# Patient Record
Sex: Female | Born: 1960 | Race: White | Hispanic: No | Marital: Married | Smoking: Current every day smoker
Health system: Southern US, Community
[De-identification: ages and names within clinical notes are randomized; demographics above are authoritative.]

## PROBLEM LIST (undated history)

## (undated) DIAGNOSIS — M810 Age-related osteoporosis without current pathological fracture: Secondary | ICD-10-CM

## (undated) DIAGNOSIS — M069 Rheumatoid arthritis, unspecified: Secondary | ICD-10-CM

## (undated) DIAGNOSIS — H01009 Unspecified blepharitis unspecified eye, unspecified eyelid: Secondary | ICD-10-CM

## (undated) DIAGNOSIS — L4050 Arthropathic psoriasis, unspecified: Secondary | ICD-10-CM

## (undated) DIAGNOSIS — M797 Fibromyalgia: Secondary | ICD-10-CM

## (undated) DIAGNOSIS — M459 Ankylosing spondylitis of unspecified sites in spine: Secondary | ICD-10-CM

## (undated) DIAGNOSIS — E119 Type 2 diabetes mellitus without complications: Secondary | ICD-10-CM

## (undated) HISTORY — DX: Arthropathic psoriasis, unspecified: L40.50

## (undated) HISTORY — DX: Unspecified blepharitis unspecified eye, unspecified eyelid: H01.009

## (undated) HISTORY — DX: Fibromyalgia: M79.7

---

## 1998-06-12 ENCOUNTER — Emergency Department (HOSPITAL_COMMUNITY): Admission: EM | Admit: 1998-06-12 | Discharge: 1998-06-12 | Payer: Self-pay | Admitting: Emergency Medicine

## 1999-03-11 ENCOUNTER — Other Ambulatory Visit: Admission: RE | Admit: 1999-03-11 | Discharge: 1999-03-11 | Payer: Self-pay | Admitting: Obstetrics and Gynecology

## 1999-11-19 ENCOUNTER — Encounter: Payer: Self-pay | Admitting: Orthopedic Surgery

## 1999-11-19 ENCOUNTER — Ambulatory Visit (HOSPITAL_COMMUNITY): Admission: RE | Admit: 1999-11-19 | Discharge: 1999-11-19 | Payer: Self-pay | Admitting: Orthopedic Surgery

## 2000-04-13 ENCOUNTER — Other Ambulatory Visit: Admission: RE | Admit: 2000-04-13 | Discharge: 2000-04-13 | Payer: Self-pay | Admitting: Obstetrics and Gynecology

## 2001-04-23 ENCOUNTER — Other Ambulatory Visit: Admission: RE | Admit: 2001-04-23 | Discharge: 2001-04-23 | Payer: Self-pay | Admitting: Obstetrics and Gynecology

## 2001-10-18 ENCOUNTER — Emergency Department (HOSPITAL_COMMUNITY): Admission: EM | Admit: 2001-10-18 | Discharge: 2001-10-18 | Payer: Self-pay | Admitting: Emergency Medicine

## 2001-10-18 ENCOUNTER — Encounter: Payer: Self-pay | Admitting: Emergency Medicine

## 2001-12-08 HISTORY — PX: CARPAL TUNNEL RELEASE: SHX101

## 2002-05-06 ENCOUNTER — Encounter: Admission: RE | Admit: 2002-05-06 | Discharge: 2002-05-06 | Payer: Self-pay | Admitting: *Deleted

## 2002-05-06 ENCOUNTER — Encounter: Payer: Self-pay | Admitting: *Deleted

## 2002-06-03 ENCOUNTER — Ambulatory Visit (HOSPITAL_COMMUNITY): Admission: RE | Admit: 2002-06-03 | Discharge: 2002-06-03 | Payer: Self-pay | Admitting: Family Medicine

## 2002-07-15 ENCOUNTER — Encounter: Payer: Self-pay | Admitting: *Deleted

## 2002-07-15 ENCOUNTER — Encounter: Admission: RE | Admit: 2002-07-15 | Discharge: 2002-07-15 | Payer: Self-pay | Admitting: *Deleted

## 2002-07-21 ENCOUNTER — Encounter: Admission: RE | Admit: 2002-07-21 | Discharge: 2002-10-19 | Payer: Self-pay | Admitting: *Deleted

## 2002-10-31 ENCOUNTER — Other Ambulatory Visit: Admission: RE | Admit: 2002-10-31 | Discharge: 2002-10-31 | Payer: Self-pay | Admitting: Obstetrics and Gynecology

## 2005-01-08 ENCOUNTER — Other Ambulatory Visit: Admission: RE | Admit: 2005-01-08 | Discharge: 2005-01-08 | Payer: Self-pay | Admitting: Obstetrics and Gynecology

## 2006-03-16 ENCOUNTER — Other Ambulatory Visit: Admission: RE | Admit: 2006-03-16 | Discharge: 2006-03-16 | Payer: Self-pay | Admitting: Obstetrics and Gynecology

## 2006-10-13 ENCOUNTER — Encounter: Admission: RE | Admit: 2006-10-13 | Discharge: 2006-10-13 | Payer: Self-pay | Admitting: Obstetrics and Gynecology

## 2006-10-26 ENCOUNTER — Encounter: Admission: RE | Admit: 2006-10-26 | Discharge: 2006-10-26 | Payer: Self-pay | Admitting: Obstetrics and Gynecology

## 2008-10-03 ENCOUNTER — Encounter: Admission: RE | Admit: 2008-10-03 | Discharge: 2008-10-03 | Payer: Self-pay | Admitting: Obstetrics and Gynecology

## 2010-12-29 ENCOUNTER — Encounter: Payer: Self-pay | Admitting: Obstetrics and Gynecology

## 2011-01-07 ENCOUNTER — Other Ambulatory Visit: Payer: Self-pay | Admitting: Obstetrics and Gynecology

## 2011-01-07 DIAGNOSIS — Z1231 Encounter for screening mammogram for malignant neoplasm of breast: Secondary | ICD-10-CM

## 2011-01-07 DIAGNOSIS — Z1239 Encounter for other screening for malignant neoplasm of breast: Secondary | ICD-10-CM

## 2011-01-20 ENCOUNTER — Ambulatory Visit
Admission: RE | Admit: 2011-01-20 | Discharge: 2011-01-20 | Disposition: A | Discharge status: Home / Self Care | Payer: BC Managed Care – PPO | Source: Ambulatory Visit | Attending: Obstetrics and Gynecology | Admitting: Obstetrics and Gynecology

## 2011-01-20 DIAGNOSIS — Z1231 Encounter for screening mammogram for malignant neoplasm of breast: Secondary | ICD-10-CM

## 2013-01-14 ENCOUNTER — Other Ambulatory Visit: Payer: Self-pay | Admitting: Obstetrics and Gynecology

## 2013-01-14 DIAGNOSIS — Z1231 Encounter for screening mammogram for malignant neoplasm of breast: Secondary | ICD-10-CM

## 2013-01-28 ENCOUNTER — Ambulatory Visit
Admission: RE | Admit: 2013-01-28 | Discharge: 2013-01-28 | Disposition: A | Discharge status: Home / Self Care | Payer: BC Managed Care – PPO | Source: Ambulatory Visit | Attending: Obstetrics and Gynecology | Admitting: Obstetrics and Gynecology

## 2013-01-28 DIAGNOSIS — Z1231 Encounter for screening mammogram for malignant neoplasm of breast: Secondary | ICD-10-CM

## 2013-01-31 ENCOUNTER — Other Ambulatory Visit: Payer: Self-pay | Admitting: Obstetrics and Gynecology

## 2013-01-31 DIAGNOSIS — R928 Other abnormal and inconclusive findings on diagnostic imaging of breast: Secondary | ICD-10-CM

## 2013-02-22 ENCOUNTER — Ambulatory Visit
Admission: RE | Admit: 2013-02-22 | Discharge: 2013-02-22 | Disposition: A | Discharge status: Home / Self Care | Payer: BC Managed Care – PPO | Source: Ambulatory Visit | Attending: Obstetrics and Gynecology | Admitting: Obstetrics and Gynecology

## 2013-02-22 DIAGNOSIS — R928 Other abnormal and inconclusive findings on diagnostic imaging of breast: Secondary | ICD-10-CM

## 2013-04-25 DIAGNOSIS — L658 Other specified nonscarring hair loss: Secondary | ICD-10-CM | POA: Insufficient documentation

## 2013-11-28 ENCOUNTER — Ambulatory Visit: Payer: BC Managed Care – PPO | Admitting: *Deleted

## 2013-12-13 ENCOUNTER — Ambulatory Visit: Payer: BC Managed Care – PPO | Admitting: *Deleted

## 2014-01-04 ENCOUNTER — Ambulatory Visit: Payer: BC Managed Care – PPO | Admitting: *Deleted

## 2015-05-02 ENCOUNTER — Other Ambulatory Visit: Payer: Self-pay

## 2015-05-02 DIAGNOSIS — Z1231 Encounter for screening mammogram for malignant neoplasm of breast: Secondary | ICD-10-CM

## 2015-06-04 ENCOUNTER — Ambulatory Visit
Admission: RE | Admit: 2015-06-04 | Discharge: 2015-06-04 | Disposition: A | Discharge status: Home / Self Care | Payer: Managed Care, Other (non HMO) | Source: Ambulatory Visit

## 2015-06-04 DIAGNOSIS — Z1231 Encounter for screening mammogram for malignant neoplasm of breast: Secondary | ICD-10-CM

## 2015-06-05 ENCOUNTER — Other Ambulatory Visit: Payer: Self-pay | Admitting: Obstetrics and Gynecology

## 2015-06-05 DIAGNOSIS — R928 Other abnormal and inconclusive findings on diagnostic imaging of breast: Secondary | ICD-10-CM

## 2015-06-12 ENCOUNTER — Other Ambulatory Visit: Payer: Managed Care, Other (non HMO)

## 2015-06-13 ENCOUNTER — Ambulatory Visit
Admission: RE | Admit: 2015-06-13 | Discharge: 2015-06-13 | Disposition: A | Discharge status: Home / Self Care | Payer: Managed Care, Other (non HMO) | Source: Ambulatory Visit | Attending: Obstetrics and Gynecology | Admitting: Obstetrics and Gynecology

## 2015-06-13 DIAGNOSIS — R928 Other abnormal and inconclusive findings on diagnostic imaging of breast: Secondary | ICD-10-CM

## 2015-08-17 DIAGNOSIS — M5416 Radiculopathy, lumbar region: Secondary | ICD-10-CM | POA: Insufficient documentation

## 2015-10-23 DIAGNOSIS — M5136 Other intervertebral disc degeneration, lumbar region: Secondary | ICD-10-CM | POA: Insufficient documentation

## 2016-10-09 ENCOUNTER — Other Ambulatory Visit: Payer: Self-pay | Admitting: Obstetrics and Gynecology

## 2016-10-09 DIAGNOSIS — Z1231 Encounter for screening mammogram for malignant neoplasm of breast: Secondary | ICD-10-CM

## 2016-10-24 ENCOUNTER — Ambulatory Visit
Admission: RE | Admit: 2016-10-24 | Discharge: 2016-10-24 | Disposition: A | Discharge status: Home / Self Care | Payer: BLUE CROSS/BLUE SHIELD | Source: Ambulatory Visit | Attending: Obstetrics and Gynecology | Admitting: Obstetrics and Gynecology

## 2016-10-24 DIAGNOSIS — Z1231 Encounter for screening mammogram for malignant neoplasm of breast: Secondary | ICD-10-CM

## 2016-12-11 ENCOUNTER — Ambulatory Visit: Payer: BLUE CROSS/BLUE SHIELD | Admitting: Neurology

## 2018-06-24 ENCOUNTER — Other Ambulatory Visit: Payer: Self-pay | Admitting: Obstetrics and Gynecology

## 2018-06-24 DIAGNOSIS — Z1231 Encounter for screening mammogram for malignant neoplasm of breast: Secondary | ICD-10-CM

## 2018-06-25 ENCOUNTER — Ambulatory Visit
Admission: RE | Admit: 2018-06-25 | Discharge: 2018-06-25 | Disposition: A | Discharge status: Home / Self Care | Payer: BLUE CROSS/BLUE SHIELD | Source: Ambulatory Visit | Attending: Obstetrics and Gynecology | Admitting: Obstetrics and Gynecology

## 2018-06-25 DIAGNOSIS — Z1231 Encounter for screening mammogram for malignant neoplasm of breast: Secondary | ICD-10-CM

## 2019-02-15 DIAGNOSIS — Z79899 Other long term (current) drug therapy: Secondary | ICD-10-CM | POA: Diagnosis not present

## 2019-05-10 DIAGNOSIS — L659 Nonscarring hair loss, unspecified: Secondary | ICD-10-CM | POA: Diagnosis not present

## 2019-05-10 DIAGNOSIS — G5621 Lesion of ulnar nerve, right upper limb: Secondary | ICD-10-CM | POA: Diagnosis not present

## 2019-05-10 DIAGNOSIS — M25531 Pain in right wrist: Secondary | ICD-10-CM | POA: Diagnosis not present

## 2019-05-30 DIAGNOSIS — R5383 Other fatigue: Secondary | ICD-10-CM | POA: Diagnosis not present

## 2019-05-30 DIAGNOSIS — R635 Abnormal weight gain: Secondary | ICD-10-CM | POA: Diagnosis not present

## 2019-05-30 DIAGNOSIS — E119 Type 2 diabetes mellitus without complications: Secondary | ICD-10-CM | POA: Diagnosis not present

## 2019-05-30 DIAGNOSIS — E559 Vitamin D deficiency, unspecified: Secondary | ICD-10-CM | POA: Diagnosis not present

## 2019-05-30 DIAGNOSIS — N951 Menopausal and female climacteric states: Secondary | ICD-10-CM | POA: Diagnosis not present

## 2019-06-02 DIAGNOSIS — E119 Type 2 diabetes mellitus without complications: Secondary | ICD-10-CM | POA: Diagnosis not present

## 2019-06-02 DIAGNOSIS — R5383 Other fatigue: Secondary | ICD-10-CM | POA: Diagnosis not present

## 2019-06-02 DIAGNOSIS — Z1339 Encounter for screening examination for other mental health and behavioral disorders: Secondary | ICD-10-CM | POA: Diagnosis not present

## 2019-06-02 DIAGNOSIS — E559 Vitamin D deficiency, unspecified: Secondary | ICD-10-CM | POA: Diagnosis not present

## 2019-06-02 DIAGNOSIS — N951 Menopausal and female climacteric states: Secondary | ICD-10-CM | POA: Diagnosis not present

## 2019-06-02 DIAGNOSIS — Z1331 Encounter for screening for depression: Secondary | ICD-10-CM | POA: Diagnosis not present

## 2019-06-07 DIAGNOSIS — Z6835 Body mass index (BMI) 35.0-35.9, adult: Secondary | ICD-10-CM | POA: Diagnosis not present

## 2019-06-07 DIAGNOSIS — E119 Type 2 diabetes mellitus without complications: Secondary | ICD-10-CM | POA: Diagnosis not present

## 2019-06-09 DIAGNOSIS — M25531 Pain in right wrist: Secondary | ICD-10-CM | POA: Diagnosis not present

## 2019-06-09 DIAGNOSIS — G5621 Lesion of ulnar nerve, right upper limb: Secondary | ICD-10-CM | POA: Diagnosis not present

## 2019-06-13 DIAGNOSIS — E559 Vitamin D deficiency, unspecified: Secondary | ICD-10-CM | POA: Diagnosis not present

## 2019-06-13 DIAGNOSIS — Z6834 Body mass index (BMI) 34.0-34.9, adult: Secondary | ICD-10-CM | POA: Diagnosis not present

## 2019-06-14 DIAGNOSIS — Z794 Long term (current) use of insulin: Secondary | ICD-10-CM | POA: Diagnosis not present

## 2019-06-14 DIAGNOSIS — E669 Obesity, unspecified: Secondary | ICD-10-CM | POA: Diagnosis not present

## 2019-06-14 DIAGNOSIS — Z5181 Encounter for therapeutic drug level monitoring: Secondary | ICD-10-CM | POA: Diagnosis not present

## 2019-06-14 DIAGNOSIS — E119 Type 2 diabetes mellitus without complications: Secondary | ICD-10-CM | POA: Diagnosis not present

## 2019-06-15 DIAGNOSIS — G5621 Lesion of ulnar nerve, right upper limb: Secondary | ICD-10-CM | POA: Diagnosis not present

## 2019-07-14 DIAGNOSIS — G5621 Lesion of ulnar nerve, right upper limb: Secondary | ICD-10-CM | POA: Diagnosis not present

## 2019-07-14 DIAGNOSIS — M25531 Pain in right wrist: Secondary | ICD-10-CM | POA: Diagnosis not present

## 2019-07-18 DIAGNOSIS — M2550 Pain in unspecified joint: Secondary | ICD-10-CM | POA: Diagnosis not present

## 2019-07-18 DIAGNOSIS — M459 Ankylosing spondylitis of unspecified sites in spine: Secondary | ICD-10-CM | POA: Diagnosis not present

## 2019-07-18 DIAGNOSIS — M549 Dorsalgia, unspecified: Secondary | ICD-10-CM | POA: Diagnosis not present

## 2019-07-18 DIAGNOSIS — M791 Myalgia, unspecified site: Secondary | ICD-10-CM | POA: Diagnosis not present

## 2019-07-28 ENCOUNTER — Encounter (INDEPENDENT_AMBULATORY_CARE_PROVIDER_SITE_OTHER): Payer: BC Managed Care – PPO | Admitting: Diagnostic Neuroimaging

## 2019-07-28 ENCOUNTER — Other Ambulatory Visit: Payer: Self-pay

## 2019-07-28 ENCOUNTER — Ambulatory Visit (INDEPENDENT_AMBULATORY_CARE_PROVIDER_SITE_OTHER): Payer: BC Managed Care – PPO | Admitting: Diagnostic Neuroimaging

## 2019-07-28 DIAGNOSIS — M79641 Pain in right hand: Secondary | ICD-10-CM | POA: Diagnosis not present

## 2019-07-28 DIAGNOSIS — Z0289 Encounter for other administrative examinations: Secondary | ICD-10-CM

## 2019-08-01 DIAGNOSIS — M79641 Pain in right hand: Secondary | ICD-10-CM | POA: Diagnosis not present

## 2019-08-01 DIAGNOSIS — M546 Pain in thoracic spine: Secondary | ICD-10-CM | POA: Diagnosis not present

## 2019-08-01 DIAGNOSIS — M25551 Pain in right hip: Secondary | ICD-10-CM | POA: Diagnosis not present

## 2019-08-01 DIAGNOSIS — M25561 Pain in right knee: Secondary | ICD-10-CM | POA: Diagnosis not present

## 2019-08-01 DIAGNOSIS — M549 Dorsalgia, unspecified: Secondary | ICD-10-CM | POA: Diagnosis not present

## 2019-08-01 DIAGNOSIS — M79642 Pain in left hand: Secondary | ICD-10-CM | POA: Diagnosis not present

## 2019-08-01 DIAGNOSIS — M2550 Pain in unspecified joint: Secondary | ICD-10-CM | POA: Diagnosis not present

## 2019-08-01 DIAGNOSIS — M459 Ankylosing spondylitis of unspecified sites in spine: Secondary | ICD-10-CM | POA: Diagnosis not present

## 2019-08-01 DIAGNOSIS — M545 Low back pain: Secondary | ICD-10-CM | POA: Diagnosis not present

## 2019-08-01 DIAGNOSIS — M791 Myalgia, unspecified site: Secondary | ICD-10-CM | POA: Diagnosis not present

## 2019-08-01 DIAGNOSIS — M50323 Other cervical disc degeneration at C6-C7 level: Secondary | ICD-10-CM | POA: Diagnosis not present

## 2019-08-01 DIAGNOSIS — M25562 Pain in left knee: Secondary | ICD-10-CM | POA: Diagnosis not present

## 2019-08-01 DIAGNOSIS — M25552 Pain in left hip: Secondary | ICD-10-CM | POA: Diagnosis not present

## 2019-08-01 DIAGNOSIS — M79671 Pain in right foot: Secondary | ICD-10-CM | POA: Diagnosis not present

## 2019-08-01 DIAGNOSIS — M79672 Pain in left foot: Secondary | ICD-10-CM | POA: Diagnosis not present

## 2019-08-01 DIAGNOSIS — M533 Sacrococcygeal disorders, not elsewhere classified: Secondary | ICD-10-CM | POA: Diagnosis not present

## 2019-08-04 NOTE — Procedures (Signed)
[facility]  NCS (NERVE CONDUCTION STUDY) WITH EMG (ELECTROMYOGRAPHY) REPORT   STUDY DATE: 08/04/19 PATIENT NAME: Jacqueline Coleman DOB: [birth year] MRN: [id number]  ORDERING CLINICIAN: Joellyn Rued  TECHNOLOGIST: Sherre Scarlet ELECTROMYOGRAPHER: Earlean Polka. Penumalli, MD  CLINICAL INFORMATION: 58 year old female here for evaluation of right hand and wrist pain and numbness.  History of right carpal tunnel release surgery in 2003.  FINDINGS: NERVE CONDUCTION STUDY:  Bilateral median and ulnar motor responses are normal.  Bilateral median and ulnar sensory responses are normal.  Bilateral ulnar F-wave latencies are normal.    NEEDLE ELECTROMYOGRAPHY:  Needle examination of right upper extremity deltoid, biceps, triceps, flexor carpi radialis, first dorsal interosseous is normal.   IMPRESSION:   This is a normal study.  No electrodiagnostic evidence of large fiber neuropathy at this time.   INTERPRETING PHYSICIAN:  Penni Bombard, MD Certified in Neurology, Neurophysiology and Neuroimaging  [facility] [address], [place], [place] [address] [phone]   [facility]    Nerve / Sites Muscle Latency Ref. Amplitude Ref. Rel Amp Segments Distance Velocity Ref. Area    ms ms mV mV %  cm m/s m/s mVms  R Median - APB     Wrist APB 3.4 ?4.4 6.2 ?4.0 100 Wrist - APB 7   16.8     Upper arm APB 7.1  5.8  92.7 Upper arm - Wrist 20 55 ?49 16.1  L Median - APB     Wrist APB 4.1 ?4.4 4.4 ?4.0 100 Wrist - APB 7   14.2     Upper arm APB 7.7  4.6  105 Upper arm - Wrist 19 53 ?49 14.8  R Ulnar - ADM     Wrist ADM 2.6 ?3.3 11.2 ?6.0 100 Wrist - ADM 7   30.2     B.Elbow ADM 5.6  9.2  81.6 B.Elbow - Wrist 17 56 ?49 27.9     A.Elbow ADM 7.4  8.7  95.3 A.Elbow - B.Elbow 10 55 ?49 26.6         A.Elbow - Wrist      L Ulnar - ADM     Wrist ADM 2.6 ?3.3 10.3 ?6.0 100 Wrist - ADM 7   26.0     B.Elbow ADM 5.5  9.0  87.2 B.Elbow - Wrist 17 58 ?49  24.9     A.Elbow ADM 7.4  9.1  101 A.Elbow - B.Elbow 10 52 ?49 25.2         A.Elbow - Wrist                 SNC    Nerve / Sites Rec. Site Peak Lat Ref.  Amp Ref. Segments Distance    ms ms V V  cm  R Median - Orthodromic (Dig II, Mid palm)     Dig II Wrist 2.9 ?3.4 14 ?10 Dig II - Wrist 13  L Median - Orthodromic (Dig II, Mid palm)     Dig II Wrist 3.3 ?3.4 13 ?10 Dig II - Wrist 13  R Ulnar - Orthodromic, (Dig V, Mid palm)     Dig V Wrist 2.6 ?3.1 8 ?5 Dig V - Wrist 11  L Ulnar - Orthodromic, (Dig V, Mid palm)     Dig V Wrist 2.4 ?3.1 9 ?5 Dig V - Wrist 31              F  Wave    Nerve F Lat Ref.  ms ms  R Ulnar - ADM 26.9 ?32.0  L Ulnar - ADM 27.1 ?32.0         EMG full       EMG Summary Table    Spontaneous MUAP Recruitment  Muscle IA Fib PSW Fasc Other Amp Dur. Poly Pattern  R. Deltoid Normal None None None _______ Normal Normal Normal Normal  R. Flexor carpi radialis Normal None None None _______ Normal Normal Normal Normal  R. First dorsal interosseous Normal None None None _______ Normal Normal Normal Normal  R. Biceps brachii Normal None None None _______ Normal Normal Normal Normal  R. Triceps brachii Normal None None None _______ Normal Normal Normal Normal

## 2019-08-17 DIAGNOSIS — R112 Nausea with vomiting, unspecified: Secondary | ICD-10-CM | POA: Diagnosis not present

## 2019-08-19 ENCOUNTER — Observation Stay (HOSPITAL_COMMUNITY)
Admission: EM | Admit: 2019-08-19 | Discharge: 2019-08-20 | Disposition: A | Discharge status: Home / Self Care | Payer: BC Managed Care – PPO | Attending: Internal Medicine | Admitting: Internal Medicine

## 2019-08-19 ENCOUNTER — Other Ambulatory Visit: Payer: Self-pay

## 2019-08-19 ENCOUNTER — Encounter (HOSPITAL_COMMUNITY): Payer: Self-pay | Admitting: *Deleted

## 2019-08-19 DIAGNOSIS — F17200 Nicotine dependence, unspecified, uncomplicated: Secondary | ICD-10-CM | POA: Diagnosis present

## 2019-08-19 DIAGNOSIS — E1110 Type 2 diabetes mellitus with ketoacidosis without coma: Secondary | ICD-10-CM | POA: Diagnosis not present

## 2019-08-19 DIAGNOSIS — M069 Rheumatoid arthritis, unspecified: Secondary | ICD-10-CM | POA: Diagnosis not present

## 2019-08-19 DIAGNOSIS — R197 Diarrhea, unspecified: Secondary | ICD-10-CM | POA: Diagnosis not present

## 2019-08-19 DIAGNOSIS — Z885 Allergy status to narcotic agent status: Secondary | ICD-10-CM | POA: Insufficient documentation

## 2019-08-19 DIAGNOSIS — F17210 Nicotine dependence, cigarettes, uncomplicated: Secondary | ICD-10-CM | POA: Insufficient documentation

## 2019-08-19 DIAGNOSIS — F1210 Cannabis abuse, uncomplicated: Secondary | ICD-10-CM | POA: Diagnosis present

## 2019-08-19 DIAGNOSIS — Z20828 Contact with and (suspected) exposure to other viral communicable diseases: Secondary | ICD-10-CM | POA: Insufficient documentation

## 2019-08-19 DIAGNOSIS — Z79899 Other long term (current) drug therapy: Secondary | ICD-10-CM | POA: Insufficient documentation

## 2019-08-19 DIAGNOSIS — M459 Ankylosing spondylitis of unspecified sites in spine: Secondary | ICD-10-CM | POA: Diagnosis present

## 2019-08-19 DIAGNOSIS — Z888 Allergy status to other drugs, medicaments and biological substances status: Secondary | ICD-10-CM | POA: Diagnosis not present

## 2019-08-19 DIAGNOSIS — R112 Nausea with vomiting, unspecified: Secondary | ICD-10-CM | POA: Diagnosis not present

## 2019-08-19 HISTORY — DX: Rheumatoid arthritis, unspecified: M06.9

## 2019-08-19 HISTORY — DX: Type 2 diabetes mellitus without complications: E11.9

## 2019-08-19 HISTORY — DX: Ankylosing spondylitis of unspecified sites in spine: M45.9

## 2019-08-19 HISTORY — DX: Age-related osteoporosis without current pathological fracture: M81.0

## 2019-08-19 LAB — CBC
HCT: 55.7 % — ABNORMAL HIGH (ref 36.0–46.0)
Hemoglobin: 19.2 g/dL — ABNORMAL HIGH (ref 12.0–15.0)
MCH: 30 pg (ref 26.0–34.0)
MCHC: 34.5 g/dL (ref 30.0–36.0)
MCV: 86.9 fL (ref 80.0–100.0)
Platelets: 355 10*3/uL (ref 150–400)
RBC: 6.41 MIL/uL — ABNORMAL HIGH (ref 3.87–5.11)
RDW: 12.7 % (ref 11.5–15.5)
WBC: 15.6 10*3/uL — ABNORMAL HIGH (ref 4.0–10.5)
nRBC: 0 % (ref 0.0–0.2)

## 2019-08-19 LAB — CBG MONITORING, ED: Glucose-Capillary: 246 mg/dL — ABNORMAL HIGH (ref 70–99)

## 2019-08-19 LAB — COMPREHENSIVE METABOLIC PANEL
ALT: 23 U/L (ref 0–44)
AST: 15 U/L (ref 15–41)
Albumin: 4.1 g/dL (ref 3.5–5.0)
Alkaline Phosphatase: 72 U/L (ref 38–126)
Anion gap: 19 — ABNORMAL HIGH (ref 5–15)
BUN: 20 mg/dL (ref 6–20)
CO2: 16 mmol/L — ABNORMAL LOW (ref 22–32)
Calcium: 9.8 mg/dL (ref 8.9–10.3)
Chloride: 98 mmol/L (ref 98–111)
Creatinine, Ser: 0.89 mg/dL (ref 0.44–1.00)
GFR calc Af Amer: >60 mL/min (ref >60)
GFR calc non Af Amer: >60 mL/min (ref >60)
Glucose, Bld: 232 mg/dL — ABNORMAL HIGH (ref 70–99)
Potassium: 3.8 mmol/L (ref 3.5–5.1)
Sodium: 133 mmol/L — ABNORMAL LOW (ref 135–145)
Total Bilirubin: 1.3 mg/dL — ABNORMAL HIGH (ref 0.3–1.2)
Total Protein: 7 g/dL (ref 6.5–8.1)

## 2019-08-19 LAB — LIPASE, BLOOD: Lipase: 24 U/L (ref 11–51)

## 2019-08-19 MED ORDER — SODIUM CHLORIDE 0.9% FLUSH: 3.0000 mL | Freq: Once | INTRAVENOUS | Status: DC

## 2019-08-19 NOTE — ED Triage Notes (Signed)
The pt is c/o nausea and vomiting since this past Tuesday no diarrhea  She has been using phenergan suppositories since this started sleeping a lot   Now she reports anxiety from these symptoms diabetic

## 2019-08-20 ENCOUNTER — Encounter (HOSPITAL_COMMUNITY): Payer: Self-pay | Admitting: Internal Medicine

## 2019-08-20 DIAGNOSIS — E1110 Type 2 diabetes mellitus with ketoacidosis without coma: Secondary | ICD-10-CM | POA: Diagnosis present

## 2019-08-20 DIAGNOSIS — M459 Ankylosing spondylitis of unspecified sites in spine: Secondary | ICD-10-CM | POA: Diagnosis present

## 2019-08-20 DIAGNOSIS — F1210 Cannabis abuse, uncomplicated: Secondary | ICD-10-CM

## 2019-08-20 DIAGNOSIS — E1010 Type 1 diabetes mellitus with ketoacidosis without coma: Secondary | ICD-10-CM | POA: Diagnosis not present

## 2019-08-20 DIAGNOSIS — F17200 Nicotine dependence, unspecified, uncomplicated: Secondary | ICD-10-CM | POA: Diagnosis present

## 2019-08-20 DIAGNOSIS — M069 Rheumatoid arthritis, unspecified: Secondary | ICD-10-CM | POA: Diagnosis present

## 2019-08-20 LAB — URINALYSIS, ROUTINE W REFLEX MICROSCOPIC
Bacteria, UA: NONE SEEN
Bilirubin Urine: NEGATIVE
Glucose, UA: >=500 mg/dL — AB
Hgb urine dipstick: NEGATIVE
Ketones, ur: 80 mg/dL — AB
Leukocytes,Ua: NEGATIVE
Nitrite: NEGATIVE
Protein, ur: NEGATIVE mg/dL
Specific Gravity, Urine: 1.04 — ABNORMAL HIGH (ref 1.005–1.030)
pH: 5 (ref 5.0–8.0)

## 2019-08-20 LAB — RAPID URINE DRUG SCREEN, HOSP PERFORMED
Amphetamines: NOT DETECTED
Barbiturates: NOT DETECTED
Benzodiazepines: NOT DETECTED
Cocaine: NOT DETECTED
Opiates: NOT DETECTED
Tetrahydrocannabinol: POSITIVE — AB

## 2019-08-20 LAB — BASIC METABOLIC PANEL
Anion gap: 12 (ref 5–15)
Anion gap: 7 (ref 5–15)
BUN: 21 mg/dL — ABNORMAL HIGH (ref 6–20)
BUN: 18 mg/dL (ref 6–20)
CO2: 20 mmol/L — ABNORMAL LOW (ref 22–32)
CO2: 24 mmol/L (ref 22–32)
Calcium: 9 mg/dL (ref 8.9–10.3)
Calcium: 8.7 mg/dL — ABNORMAL LOW (ref 8.9–10.3)
Chloride: 102 mmol/L (ref 98–111)
Chloride: 104 mmol/L (ref 98–111)
Creatinine, Ser: 0.84 mg/dL (ref 0.44–1.00)
Creatinine, Ser: 0.79 mg/dL (ref 0.44–1.00)
GFR calc Af Amer: >60 mL/min (ref >60)
GFR calc Af Amer: >60 mL/min (ref >60)
GFR calc non Af Amer: >60 mL/min (ref >60)
GFR calc non Af Amer: >60 mL/min (ref >60)
Glucose, Bld: 146 mg/dL — ABNORMAL HIGH (ref 70–99)
Glucose, Bld: 186 mg/dL — ABNORMAL HIGH (ref 70–99)
Potassium: 3.5 mmol/L (ref 3.5–5.1)
Potassium: 3.4 mmol/L — ABNORMAL LOW (ref 3.5–5.1)
Sodium: 134 mmol/L — ABNORMAL LOW (ref 135–145)
Sodium: 135 mmol/L (ref 135–145)

## 2019-08-20 LAB — CBC WITH DIFFERENTIAL/PLATELET
Abs Immature Granulocytes: 0.1 10*3/uL — ABNORMAL HIGH (ref 0.00–0.07)
Basophils Absolute: 0.1 10*3/uL (ref 0.0–0.1)
Basophils Relative: 0 %
Eosinophils Absolute: 0.1 10*3/uL (ref 0.0–0.5)
Eosinophils Relative: 1 %
HCT: 49.8 % — ABNORMAL HIGH (ref 36.0–46.0)
Hemoglobin: 17.4 g/dL — ABNORMAL HIGH (ref 12.0–15.0)
Immature Granulocytes: 1 %
Lymphocytes Relative: 32 %
Lymphs Abs: 4.2 10*3/uL — ABNORMAL HIGH (ref 0.7–4.0)
MCH: 30.2 pg (ref 26.0–34.0)
MCHC: 34.9 g/dL (ref 30.0–36.0)
MCV: 86.5 fL (ref 80.0–100.0)
Monocytes Absolute: 1.5 10*3/uL — ABNORMAL HIGH (ref 0.1–1.0)
Monocytes Relative: 11 %
Neutro Abs: 7.1 10*3/uL (ref 1.7–7.7)
Neutrophils Relative %: 55 %
Platelets: 284 10*3/uL (ref 150–400)
RBC: 5.76 MIL/uL — ABNORMAL HIGH (ref 3.87–5.11)
RDW: 12.8 % (ref 11.5–15.5)
WBC: 13 10*3/uL — ABNORMAL HIGH (ref 4.0–10.5)
nRBC: 0 % (ref 0.0–0.2)

## 2019-08-20 LAB — CBG MONITORING, ED
Glucose-Capillary: 164 mg/dL — ABNORMAL HIGH (ref 70–99)
Glucose-Capillary: 148 mg/dL — ABNORMAL HIGH (ref 70–99)
Glucose-Capillary: 149 mg/dL — ABNORMAL HIGH (ref 70–99)
Glucose-Capillary: 139 mg/dL — ABNORMAL HIGH (ref 70–99)
Glucose-Capillary: 136 mg/dL — ABNORMAL HIGH (ref 70–99)
Glucose-Capillary: 179 mg/dL — ABNORMAL HIGH (ref 70–99)
Glucose-Capillary: 194 mg/dL — ABNORMAL HIGH (ref 70–99)
Glucose-Capillary: 184 mg/dL — ABNORMAL HIGH (ref 70–99)

## 2019-08-20 LAB — SARS CORONAVIRUS 2 (TAT 6-24 HRS): SARS Coronavirus 2: NEGATIVE

## 2019-08-20 MED ORDER — INSULIN ASPART 100 UNIT/ML ~~LOC~~ SOLN: 0.0000 [IU] | SUBCUTANEOUS | Status: DC

## 2019-08-20 MED ORDER — INSULIN DETEMIR 100 UNIT/ML ~~LOC~~ SOLN
15.0000 [IU] | Freq: Once | SUBCUTANEOUS | Status: DC
  Filled 2019-08-20: qty 0.15

## 2019-08-20 MED ORDER — INSULIN REGULAR(HUMAN) IN NACL 100-0.9 UT/100ML-% IV SOLN
INTRAVENOUS | Status: DC
  Administered 2019-08-20: 1 [IU]/h via INTRAVENOUS
  Filled 2019-08-20: qty 100

## 2019-08-20 MED ORDER — POTASSIUM CHLORIDE 10 MEQ/100ML IV SOLN
10.0000 meq | INTRAVENOUS | Status: AC
  Administered 2019-08-20: 10 meq via INTRAVENOUS
  Filled 2019-08-20: qty 100

## 2019-08-20 MED ORDER — SODIUM CHLORIDE 0.9 % IV SOLN
INTRAVENOUS | Status: DC
  Administered 2019-08-20: 08:00:00 via INTRAVENOUS

## 2019-08-20 MED ORDER — INSULIN GLARGINE 100 UNIT/ML SOLOSTAR PEN: 15.0000 [IU] | PEN_INJECTOR | Freq: Every day | SUBCUTANEOUS | Status: DC

## 2019-08-20 MED ORDER — ONDANSETRON HCL 4 MG/2ML IJ SOLN
4.0000 mg | Freq: Once | INTRAMUSCULAR | Status: AC
  Administered 2019-08-20: 4 mg via INTRAVENOUS
  Filled 2019-08-20: qty 2

## 2019-08-20 MED ORDER — DEXTROSE-NACL 5-0.45 % IV SOLN
INTRAVENOUS | Status: DC
  Administered 2019-08-20: 07:00:00 via INTRAVENOUS

## 2019-08-20 MED ORDER — ONDANSETRON 4 MG PO TBDP: ORAL_TABLET | ORAL | 0 refills | Status: DC

## 2019-08-20 MED ORDER — FOLIC ACID 1 MG PO TABS: 1.0000 mg | ORAL_TABLET | Freq: Every day | ORAL | Status: DC

## 2019-08-20 MED ORDER — INSULIN DETEMIR 100 UNIT/ML ~~LOC~~ SOLN
15.0000 [IU] | Freq: Once | SUBCUTANEOUS | Status: AC
  Administered 2019-08-20: 15 [IU] via SUBCUTANEOUS

## 2019-08-20 MED ORDER — GABAPENTIN 100 MG PO CAPS: 100.0000 mg | ORAL_CAPSULE | Freq: Every day | ORAL | Status: DC

## 2019-08-20 MED ORDER — SODIUM CHLORIDE 0.9 % IV BOLUS
1000.0000 mL | Freq: Once | INTRAVENOUS | Status: AC
  Administered 2019-08-20: 1000 mL via INTRAVENOUS

## 2019-08-20 NOTE — ED Provider Notes (Signed)
[facility] EMERGENCY DEPARTMENT Provider Note   CSN: [id number] Arrival date & time: 08/19/19  2108     History   Chief Complaint Chief Complaint  Patient presents with  . Emesis    HPI Jacqueline Coleman is a 58 y.o. female with a history of rheumatoid arthritis, ankylosing spondylitis, and diabetes mellitus who presents to the emergency department with complaints of nausea and vomiting for the past 5 days.  Patient states that she has been unable to tolerate p.o. Too numerous to count episodes of emesis have occurred.  States she is been able to keep down a banana popsicle and a little bit of water ice and this is all.  Otherwise she vomits each time she tries to drink or eat.  Other than p.o. intake no alleviating or aggravating factors.  She does have associated sweats/chills but no measured fevers at home and has been checking her temperature.  She denies chest pain, dyspnea, abdominal pain, melena, or hematochezia.  She states that she has loose stools at baseline.  No one is sick with similar symptoms.  She occasionally will utilize marijuana, but has not in the past couple of weeks has never had issues with nausea/vomiting previously.  She is on methotrexate for her rheumatoid arthritis, started this 3 weeks ago, did not take it this week per discussion with her rheumatologist.  She also started gabapentin a few months ago.  No other med changes.     HPI  Past Medical History:  Diagnosis Date  . Diabetes mellitus without complication (HCC)     There are no active problems to display for this patient.   No past surgical history on file.   OB History   No obstetric history on file.      Home Medications    Prior to Admission medications   Not on File    Family History Family History  Problem Relation Age of Onset  . Breast cancer Neg Hx     Social History Social History   Tobacco Use  . Smoking status: Current Every Day Smoker  .  Smokeless tobacco: Never Used  Substance Use Topics  . Alcohol use: Not on file  . Drug use: Never     Allergies   Patient has no known allergies.   Review of Systems Review of Systems  Constitutional: Positive for chills. Negative for fever.  HENT: Negative for congestion and ear pain.   Respiratory: Negative for cough and shortness of breath.   Cardiovascular: Negative for chest pain.  Gastrointestinal: Positive for diarrhea (unchanged), nausea and vomiting. Negative for abdominal pain, anal bleeding, blood in stool and constipation.  Genitourinary: Negative for dysuria, vaginal bleeding and vaginal discharge.  Neurological: Positive for weakness (generalized). Negative for syncope.  All other systems reviewed and are negative.    Physical Exam Updated Vital Signs BP 127/75   Pulse 88   Temp 98.3 F (36.8 C) (Oral)   Resp 16   Ht 5\' 3"  (1.6 m)   Wt 83.9 kg   SpO2 97%   BMI 32.77 kg/m   Physical Exam Vitals signs and nursing note reviewed.  Constitutional:      General: She is not in acute distress.    Appearance: She is well-developed. She is not toxic-appearing.  HENT:     Head: Normocephalic and atraumatic.     Mouth/Throat:     Mouth: Mucous membranes are dry.  Eyes:     General:  Right eye: No discharge.        Left eye: No discharge.     Conjunctiva/sclera: Conjunctivae normal.  Neck:     Musculoskeletal: Neck supple.  Cardiovascular:     Rate and Rhythm: Normal rate and regular rhythm.  Pulmonary:     Effort: Pulmonary effort is normal. No respiratory distress.     Breath sounds: Normal breath sounds. No wheezing, rhonchi or rales.  Abdominal:     General: There is no distension.     Palpations: Abdomen is soft.     Tenderness: There is no abdominal tenderness. There is no guarding or rebound.  Skin:    General: Skin is warm and dry.     Findings: No rash.  Neurological:     Mental Status: She is alert.     Comments: Clear speech.    Psychiatric:        Behavior: Behavior normal.    ED Treatments / Results  Labs (all labs ordered are listed, but only abnormal results are displayed) Labs Reviewed  COMPREHENSIVE METABOLIC PANEL - Abnormal; Notable for the following components:      Result Value   Sodium 133 (*)    CO2 16 (*)    Glucose, Bld 232 (*)    Total Bilirubin 1.3 (*)    Anion gap 19 (*)    All other components within normal limits  CBC - Abnormal; Notable for the following components:   WBC 15.6 (*)    RBC 6.41 (*)    Hemoglobin 19.2 (*)    HCT 55.7 (*)    All other components within normal limits  URINALYSIS, ROUTINE W REFLEX MICROSCOPIC - Abnormal; Notable for the following components:   APPearance CLOUDY (*)    Specific Gravity, Urine 1.040 (*)    Glucose, UA >=500 (*)    Ketones, ur 80 (*)    All other components within normal limits  CBG MONITORING, ED - Abnormal; Notable for the following components:   Glucose-Capillary 246 (*)    All other components within normal limits  LIPASE, BLOOD    EKG None  Radiology No results found.  Procedures .Critical Care Performed by: Amaryllis Dyke, PA-C Authorized by: Amaryllis Dyke, PA-C    CRITICAL CARE Performed by: Kennith Maes   Total critical care time: 30 minutes  Critical care time was exclusive of separately billable procedures and treating other patients.  Critical care was necessary to treat or prevent imminent or life-threatening deterioration.  Critical care was time spent personally by me on the following activities: development of treatment plan with patient and/or surrogate as well as nursing, discussions with consultants, evaluation of patient's response to treatment, examination of patient, obtaining history from patient or surrogate, ordering and performing treatments and interventions, ordering and review of laboratory studies, ordering and review of radiographic studies, pulse oximetry and  re-evaluation of patient's condition.  (including critical care time)  Medications Ordered in ED Medications  sodium chloride flush (NS) 0.9 % injection 3 mL (has no administration in time range)     Initial Impression / Assessment and Plan / ED Course  I have reviewed the triage vital signs and the nursing notes.  Pertinent labs & imaging results that were available during my care of the patient were reviewed by me and considered in my medical decision making (see chart for details).   Patient presents to the emergency department with intractable nausea and vomiting over the past 5 days.  She is nontoxic-appearing,  in no apparent distress, vitals without significant abnormality.  Her mucous membranes are dry.  She has no abdominal tenderness or peritoneal signs.  Work-up per triage: CBC: Leukocytosis of 15.6.  Elevated hemoglobin/hematocrit which expect is to a degree of hemoconcentration. CMP: Hyperglycemia, low bicarb, elevated anion gap.  LFTs and renal function are preserved.  No significant electrolyte derangement Lipase: WNL Urinalysis: Not consistent with UTI.  Signs of dehydration with elevated specific gravity.  Glucosuria and ketonuria.  Patient appears to be in a degree of DKA. Unclear etiology to her intractable nausea/vomiting at home.  Her abdomen is nontender without peritoneal signs, doubt cholecystitis, pancreatitis, appendicitis, perforation/obstruction. Will administer fluids, zofran, & start insulin. Consult placed to hospitalist service for admission.   06:15: CONSULT: Discussed wit hospitalist Dr. Toniann Fail- accepts admission.   Findings and plan of care discussed with supervising physician Dr. Judd Lien who is in agreement.    Final Clinical Impressions(s) / ED Diagnoses   Final diagnoses:  Diabetic ketoacidosis without coma associated with type 2 diabetes mellitus [facility])    ED Discharge Orders    None       Cherly Anderson, PA-C 08/20/19 9628     Geoffery Lyons, MD 08/20/19 2315

## 2019-08-20 NOTE — Discharge Instructions (Addendum)
Continue your regular diabetes medications.  Follow-up with your primary care doctor for recheck.  Discontinue your methotrexate until Monday when you can contact your rheumatologist for further guidance.  Return here as needed for any worsening symptoms.  Monitor your blood sugars frequently at home.

## 2019-08-20 NOTE — ED Notes (Signed)
Pt verbalized understanding of discharge instructions and follow up care. IVs removed and bleeding controlled. Pt wheeled out in chair. Ambulatory to car where husband was waiting to pick up. Alert and oriented X4. Opportunity given to ask and have questions answered.

## 2019-08-20 NOTE — Consult Note (Signed)
ER Consult Note   Jacqueline Coleman UYQ:[id number] DOB: [birth year] DOA: 08/19/2019  PCP:  Eldred Manges Consultants:  [facility] - neurology; [facility] - rheumatology; Buddy Duty - endocrinology; Corinna Capra - GYN; [facility] - orthopedics Patient coming from:  Home - lives with husband; NOK: Husband, [phone]  Chief Complaint: N/V  HPI: Jacqueline Coleman is a 58 y.o. female with medical history significant of RA; ankylosing spondylitis; and DM presenting with n/v.  Tuesday, she was fine and was doing daily routine.  She drank 2 sips of coffee and began vomiting.  She hasn't stopped vomiting since.  She felt very anxious, her heart rate felt so fast.  She couldn't get enough to drink and then would vomit as soon as she drank.  She couldn't stand to eat or even smell food.  She has been quite stressed - 3 weeks ago, her brother attempted suicide, was on a ventilator, and is in a psych ward.  She was recently diagnosed with RA and osteoporosis recently, as well.  She was started on methotrexate, folate, and neurontin 3 weeks ago.  No diarrhea this week, but regularly has been having loose stools since starting methotrexate.  No fevers.     ED Course:  Carryover, per Dr. Adrienne Mocha:  58 year old female with a history of diabetes mellitus presents to the ER with complaints of persistent nausea vomiting. Patient is found to be in mild DKA. Admitted for same. COVID-19 test is pending.  Review of Systems: As per HPI; otherwise review of systems reviewed and negative.   Ambulatory Status:  Ambulates without assistance  Past Medical History:  Diagnosis Date  . Ankylosing spondylitis ([place])   . Diabetes mellitus without complication ([place])   . Osteoporosis   . Rheumatoid arthritis [facility])     Past Surgical History:  Procedure Laterality Date  . CARPAL TUNNEL RELEASE Right 2003  . CESAREAN SECTION      Social History   Socioeconomic History  . Marital status: Married    Spouse name: Not on file   . Number of children: Not on file  . Years of education: Not on file  . Highest education level: Not on file  Occupational History  . Occupation: retired  Scientific laboratory technician  . Financial resource strain: Not on file  . Food insecurity    Worry: Not on file    Inability: Not on file  . Transportation needs    Medical: Not on file    Non-medical: Not on file  Tobacco Use  . Smoking status: Current Every Day Smoker    Packs/day: 0.75    Years: 30.00    Pack years: 22.50  . Smokeless tobacco: Never Used  Substance and Sexual Activity  . Alcohol use: Not Currently  . Drug use: Yes    Types: Marijuana    Comment: last use a week ago  . Sexual activity: Never  Lifestyle  . Physical activity    Days per week: Not on file    Minutes per session: Not on file  . Stress: Not on file  Relationships  . Social Herbalist on phone: Not on file    Gets together: Not on file    Attends religious service: Not on file    Active member of club or organization: Not on file    Attends meetings of clubs or organizations: Not on file    Relationship status: Not on file  . Intimate partner violence    Fear of current or  ex partner: Not on file    Emotionally abused: Not on file    Physically abused: Not on file    Forced sexual activity: Not on file  Other Topics Concern  . Not on file  Social History Narrative  . Not on file    Allergies  Allergen Reactions  . Codeine Itching  . Lisinopril Other (See Comments)    Back pain  . Prednisone Other (See Comments)    Adverse reaction     Family History  Problem Relation Age of Onset  . Breast cancer Neg Hx     Prior to Admission medications   Medication Sig Start Date End Date Taking? Authorizing Provider  methotrexate (RHEUMATREX) 2.5 MG tablet Take 25 mg by mouth every Thursday. 08/04/19  Yes [provider]    Physical Exam: Vitals:   08/20/19 0226 08/20/19 0630 08/20/19 0757 08/20/19 1041  BP: 127/75 (!) 141/73  130/62 128/67  Pulse: 88 67 80 70  Resp: 16 16 16 17   Temp:      TempSrc:      SpO2: 97% 97% 99% 99%  Weight:      Height:         . General:  Appears calm and comfortable and is NAD . Eyes:  PERRL, EOMI, normal lids, iris . ENT:  grossly normal hearing, lips & tongue, mmm; appropriate dentition . Neck:  no LAD, masses or thyromegaly . Cardiovascular:  RRR, no m/r/g. No LE edema.  Marland Kitchen Respiratory:   CTA bilaterally with no wheezes/rales/rhonchi.  Normal respiratory effort. . Abdomen:  soft, NT, ND, NABS . Skin:  no rash or induration seen on limited exam . Musculoskeletal:  grossly normal tone BUE/BLE, good ROM, no bony abnormality . Psychiatric:  grossly normal mood and affect, speech fluent and appropriate, AOx3 . Neurologic:  CN 2-12 grossly intact, moves all extremities in coordinated fashion, sensation intact    Radiological Exams on Admission: No results found.  EKG: not done   Labs on Admission: I have personally reviewed the available labs and imaging studies at the time of the admission.  Pertinent labs:   Na++ 133 -> 134 CO2 16 -> 20 Glucose 232 Anion gap 19 - > 9 Bili 1.3 WBC 15.6 -> 13.0 Hgb 19.2 -> 17.4 COVID pending   Assessment/Plan Principal Problem:   DKA (diabetic ketoacidoses) (HCC) Active Problems:   Rheumatoid arthritis (HCC)   Ankylosing spondylitis (HCC)   Tobacco dependence   Marijuana abuse   DKA -Patient with known and long-standing DM on Lantus who recently was started on methotrexate  -She developed n/v Tuesday and progressed since -Mild DKA on initial evaluation -No indication of illness as source -Otherwise no apparent reason for DKA -Patient was placed in observation status for Glucostabilizer protocol -K+ 3.8 on presentation and so potassium supplementation added -IVF at 150 cc/hr, NS until glucose <250 and then decrease rate to 125 and change to D51/2NS -With repeat morning labs, her gap has closed and her CO2 has increased  to 20 -As such, I have transitioned her to basal insulin with SSI and allowed the patient to eat -Assuming ongoing good glycemic control, will check 12PM BMP and likely d/c to home this PM -Hold methotrexate until able to discuss with rheum -Resume home DM regimen after discharge  RA -Recently diagnosed and started on MTX -It is possible that the n/v started as a reaction to the medication and progressed to DKA -Hold methotrexate for now and discuss further with endocrinology  Note: This patient has been tested and is pending for the novel coronavirus COVID-19.   Thank you for this interesting consult.  Assuming the 12PM BMP comes back reassuring, it is reasonable to discharge the patient to home this PM with outpatient PCP/endocrinology/rheumatology follow up.   Jonah Blue MD [facility]   How to contact the [facility] Attending or Consulting provider 7A - 7P or covering provider during after hours 7P -7A, for this patient?  1. Check the care team in [facility] and look for a) attending/consulting [facility] provider listed and b) the [facility] team listed 2. Log into [email or web address] and use [place]'s universal password to access. If you do not have the password, please contact the hospital operator. 3. Locate the [facility] provider you are looking for under [facility] and page to a number that you can be directly reached. 4. If you still have difficulty reaching the provider, please page the [facility] (Director on Call) for the Hospitalists listed on amion for assistance.   08/20/2019, 11:04 AM

## 2019-08-20 NOTE — ED Notes (Signed)
Checked patient blood sugar it was 149 notified RN of the blood sugar patient is resting with call bell in reach and family at bedside

## 2019-08-20 NOTE — ED Notes (Signed)
Checked patient cbg it was 3 notified RN of blood sugar patient is resting with call bell in reach and family at bedside

## 2019-08-20 NOTE — ED Provider Notes (Signed)
Patient was initially admitted for observation to the hospitalist service for DKA.  She was boarding in the ED and during that time she had adequate treatment of her DKA.  Her gap is closed.  She is tolerating oral fluids and feels much better.  The hospitalist has seen the patient and opted for discharge.  I had this discussion with the patient who is ready to go home.  She will follow-up with her PCP.  It has been recommended that she stop her methotrexate until she contacts her rheumatologist on Monday for further guidance.   Malvin Johns, MD 08/20/19 [phone]

## 2019-08-20 NOTE — ED Notes (Signed)
Hooked patient back up after the bathroom patient is resting with call bell in reach 

## 2019-08-20 NOTE — Consult Note (Signed)
ER Consult Note   Jacqueline Coleman UYQ:[id number] DOB: [birth year] DOA: 08/19/2019  PCP:  [facility] Consultants:  [facility] - neurology; [facility] - rheumatology; Buddy Duty - endocrinology; Corinna Capra - GYN; [facility] - orthopedics Patient coming from:  Home - lives with husband; NOK: Husband, [phone]  Chief Complaint: N/V  HPI: Jacqueline Coleman is a 58 y.o. female with medical history significant of RA; ankylosing spondylitis; and DM presenting with n/v.  Tuesday, she was fine and was doing daily routine.  She drank 2 sips of coffee and began vomiting.  She hasn't stopped vomiting since.  She felt very anxious, her heart rate felt so fast.  She couldn't get enough to drink and then would vomit as soon as she drank.  She couldn't stand to eat or even smell food.  She has been quite stressed - 3 weeks ago, her brother attempted suicide, was on a ventilator, and is in a psych ward.  She was recently diagnosed with RA and osteoporosis recently, as well.  She was started on methotrexate, folate, and neurontin 3 weeks ago.  No diarrhea this week, but regularly has been having loose stools since starting methotrexate.  No fevers.     ED Course:  Carryover, per Dr. Adrienne Mocha:  58 year old female with a history of diabetes mellitus presents to the ER with complaints of persistent nausea vomiting. Patient is found to be in mild DKA. Admitted for same. COVID-19 test is pending.  Review of Systems: As per HPI; otherwise review of systems reviewed and negative.   Ambulatory Status:  Ambulates without assistance  Past Medical History:  Diagnosis Date  . Ankylosing spondylitis ([place])   . Diabetes mellitus without complication ([place])   . Osteoporosis   . Rheumatoid arthritis [facility])     Past Surgical History:  Procedure Laterality Date  . CARPAL TUNNEL RELEASE Right 2003  . CESAREAN SECTION      Social History   Socioeconomic History  . Marital status: Married    Spouse name: Not on file   . Number of children: Not on file  . Years of education: Not on file  . Highest education level: Not on file  Occupational History  . Occupation: retired  Scientific laboratory technician  . Financial resource strain: Not on file  . Food insecurity    Worry: Not on file    Inability: Not on file  . Transportation needs    Medical: Not on file    Non-medical: Not on file  Tobacco Use  . Smoking status: Current Every Day Smoker    Packs/day: 0.75    Years: 30.00    Pack years: 22.50  . Smokeless tobacco: Never Used  Substance and Sexual Activity  . Alcohol use: Not Currently  . Drug use: Yes    Types: Marijuana    Comment: last use a week ago  . Sexual activity: Never  Lifestyle  . Physical activity    Days per week: Not on file    Minutes per session: Not on file  . Stress: Not on file  Relationships  . Social Herbalist on phone: Not on file    Gets together: Not on file    Attends religious service: Not on file    Active member of club or organization: Not on file    Attends meetings of clubs or organizations: Not on file    Relationship status: Not on file  . Intimate partner violence    Fear of current or  ex partner: Not on file    Emotionally abused: Not on file    Physically abused: Not on file    Forced sexual activity: Not on file  Other Topics Concern  . Not on file  Social History Narrative  . Not on file    Allergies  Allergen Reactions  . Codeine Itching  . Lisinopril Other (See Comments)    Back pain  . Prednisone Other (See Comments)    Adverse reaction     Family History  Problem Relation Age of Onset  . Breast cancer Neg Hx     Prior to Admission medications   Medication Sig Start Date End Date Taking? Authorizing Provider  methotrexate (RHEUMATREX) 2.5 MG tablet Take 25 mg by mouth every Thursday. 08/04/19  Yes [provider]    Physical Exam: Vitals:   08/20/19 0226 08/20/19 0630 08/20/19 0757 08/20/19 1041  BP: 127/75 (!) 141/73  130/62 128/67  Pulse: 88 67 80 70  Resp: 16 16 16 17   Temp:      TempSrc:      SpO2: 97% 97% 99% 99%  Weight:      Height:         . General:  Appears calm and comfortable and is NAD . Eyes:  PERRL, EOMI, normal lids, iris . ENT:  grossly normal hearing, lips & tongue, mmm; appropriate dentition . Neck:  no LAD, masses or thyromegaly . Cardiovascular:  RRR, no m/r/g. No LE edema.  Respiratory:   CTA bilaterally with no wheezes/rales/rhonchi.  Normal respiratory effort. . Abdomen:  soft, NT, ND, NABS . Skin:  no rash or induration seen on limited exam . Musculoskeletal:  grossly normal tone BUE/BLE, good ROM, no bony abnormality . Psychiatric:  grossly normal mood and affect, speech fluent and appropriate, AOx3 . Neurologic:  CN 2-12 grossly intact, moves all extremities in coordinated fashion, sensation intact    Radiological Exams on Admission: No results found.  EKG: not done   Labs on Admission: I have personally reviewed the available labs and imaging studies at the time of the admission.  Pertinent labs:   Na++ 133 -> 134 CO2 16 -> 20 Glucose 232 Anion gap 19 - > 9 Bili 1.3 WBC 15.6 -> 13.0 Hgb 19.2 -> 17.4 COVID pending   Assessment/Plan Principal Problem:   DKA (diabetic ketoacidoses) (HCC) Active Problems:   Rheumatoid arthritis (HCC)   Ankylosing spondylitis (HCC)   Tobacco dependence   Marijuana abuse   DKA -Patient with known and long-standing DM on Lantus who recently was started on methotrexate  -She developed n/v Tuesday and progressed since -Mild DKA on initial evaluation -No indication of illness as source -Otherwise no apparent reason for DKA -Patient was placed in observation status for Glucostabilizer protocol -K+ 3.8 on presentation and so potassium supplementation added -IVF at 150 cc/hr, NS until glucose <250 and then decrease rate to 125 and change to D51/2NS -With repeat morning labs, her gap has closed and her CO2 has increased  to 20 -As such, I have transitioned her to basal insulin with SSI and allowed the patient to eat -Assuming ongoing good glycemic control, will check 12PM BMP and likely d/c to home this PM -Hold methotrexate until able to discuss with rheum -Resume home DM regimen after discharge  RA -Recently diagnosed and started on MTX -It is possible that the n/v started as a reaction to the medication and progressed to DKA -Hold methotrexate for now and discuss further with endocrinology  Marijuana abuse -Patient acknowledges intermittent use of THC -Will check UDS -Cessation encouraged  Tobacco dependence -Encourage cessation.   -This was discussed with the patient and should be reviewed on an ongoing basis.   -Patch declined by patient.     Note: This patient has been tested and is pending for the novel coronavirus COVID-19.   Thank you for this interesting consult.  Assuming the 12PM BMP comes back reassuring, it is reasonable to discharge the patient to home this PM with outpatient PCP/endocrinology/rheumatology follow up.   Jonah Blue MD [facility]   How to contact the [facility] Attending or Consulting provider 7A - 7P or covering provider during after hours 7P -7A, for this patient?  1. Check the care team in [facility] and look for a) attending/consulting TRH provider listed and b) the [facility] team listed 2. Log into [email or web address] and use [place]'s universal password to access. If you do not have the password, please contact the hospital operator. 3. Locate the [facility] provider you are looking for under [facility] and page to a number that you can be directly reached. 4. If you still have difficulty reaching the provider, please page the [facility] (Director on Call) for the Hospitalists listed on amion for assistance.   08/20/2019, 11:21 AM

## 2019-08-22 LAB — HEMOGLOBIN A1C
Hgb A1c MFr Bld: 8 % — ABNORMAL HIGH (ref 4.8–5.6)
Mean Plasma Glucose: 183 mg/dL

## 2019-08-23 DIAGNOSIS — E1169 Type 2 diabetes mellitus with other specified complication: Secondary | ICD-10-CM | POA: Diagnosis not present

## 2019-08-23 DIAGNOSIS — B370 Candidal stomatitis: Secondary | ICD-10-CM | POA: Diagnosis not present

## 2019-08-23 DIAGNOSIS — M069 Rheumatoid arthritis, unspecified: Secondary | ICD-10-CM | POA: Diagnosis not present

## 2019-09-01 DIAGNOSIS — M2550 Pain in unspecified joint: Secondary | ICD-10-CM | POA: Diagnosis not present

## 2019-09-01 DIAGNOSIS — M791 Myalgia, unspecified site: Secondary | ICD-10-CM | POA: Diagnosis not present

## 2019-09-01 DIAGNOSIS — M549 Dorsalgia, unspecified: Secondary | ICD-10-CM | POA: Diagnosis not present

## 2019-09-01 DIAGNOSIS — E1169 Type 2 diabetes mellitus with other specified complication: Secondary | ICD-10-CM | POA: Insufficient documentation

## 2019-09-01 DIAGNOSIS — R202 Paresthesia of skin: Secondary | ICD-10-CM | POA: Diagnosis not present

## 2019-09-02 DIAGNOSIS — Z6834 Body mass index (BMI) 34.0-34.9, adult: Secondary | ICD-10-CM | POA: Diagnosis not present

## 2019-09-02 DIAGNOSIS — Z01419 Encounter for gynecological examination (general) (routine) without abnormal findings: Secondary | ICD-10-CM | POA: Diagnosis not present

## 2019-09-02 DIAGNOSIS — N951 Menopausal and female climacteric states: Secondary | ICD-10-CM | POA: Diagnosis not present

## 2019-09-04 ENCOUNTER — Emergency Department (HOSPITAL_COMMUNITY)
Admission: EM | Admit: 2019-09-04 | Discharge: 2019-09-05 | Disposition: A | Discharge status: Home / Self Care | Payer: BC Managed Care – PPO | Attending: Emergency Medicine | Admitting: Emergency Medicine

## 2019-09-04 ENCOUNTER — Other Ambulatory Visit: Payer: Self-pay

## 2019-09-04 DIAGNOSIS — R9431 Abnormal electrocardiogram [ECG] [EKG]: Secondary | ICD-10-CM | POA: Diagnosis not present

## 2019-09-04 DIAGNOSIS — Z79899 Other long term (current) drug therapy: Secondary | ICD-10-CM | POA: Diagnosis not present

## 2019-09-04 DIAGNOSIS — R531 Weakness: Secondary | ICD-10-CM | POA: Diagnosis not present

## 2019-09-04 DIAGNOSIS — M069 Rheumatoid arthritis, unspecified: Secondary | ICD-10-CM | POA: Diagnosis not present

## 2019-09-04 DIAGNOSIS — F1290 Cannabis use, unspecified, uncomplicated: Secondary | ICD-10-CM | POA: Insufficient documentation

## 2019-09-04 DIAGNOSIS — R110 Nausea: Secondary | ICD-10-CM | POA: Diagnosis not present

## 2019-09-04 DIAGNOSIS — R1111 Vomiting without nausea: Secondary | ICD-10-CM | POA: Diagnosis not present

## 2019-09-04 DIAGNOSIS — E119 Type 2 diabetes mellitus without complications: Secondary | ICD-10-CM | POA: Diagnosis not present

## 2019-09-04 DIAGNOSIS — F17210 Nicotine dependence, cigarettes, uncomplicated: Secondary | ICD-10-CM | POA: Diagnosis not present

## 2019-09-04 DIAGNOSIS — R61 Generalized hyperhidrosis: Secondary | ICD-10-CM | POA: Diagnosis not present

## 2019-09-04 DIAGNOSIS — R112 Nausea with vomiting, unspecified: Secondary | ICD-10-CM | POA: Diagnosis not present

## 2019-09-04 LAB — LIPASE, BLOOD: Lipase: 24 U/L (ref 11–51)

## 2019-09-04 LAB — CBC WITH DIFFERENTIAL/PLATELET
Abs Immature Granulocytes: 0.08 10*3/uL — ABNORMAL HIGH (ref 0.00–0.07)
Basophils Absolute: 0.1 10*3/uL (ref 0.0–0.1)
Basophils Relative: 0 %
Eosinophils Absolute: 0 10*3/uL (ref 0.0–0.5)
Eosinophils Relative: 0 %
HCT: 51 % — ABNORMAL HIGH (ref 36.0–46.0)
Hemoglobin: 17 g/dL — ABNORMAL HIGH (ref 12.0–15.0)
Immature Granulocytes: 1 %
Lymphocytes Relative: 12 %
Lymphs Abs: 1.9 10*3/uL (ref 0.7–4.0)
MCH: 30 pg (ref 26.0–34.0)
MCHC: 33.3 g/dL (ref 30.0–36.0)
MCV: 90.1 fL (ref 80.0–100.0)
Monocytes Absolute: 0.6 10*3/uL (ref 0.1–1.0)
Monocytes Relative: 4 %
Neutro Abs: 12.8 10*3/uL — ABNORMAL HIGH (ref 1.7–7.7)
Neutrophils Relative %: 83 %
Platelets: 298 10*3/uL (ref 150–400)
RBC: 5.66 MIL/uL — ABNORMAL HIGH (ref 3.87–5.11)
RDW: 13.1 % (ref 11.5–15.5)
WBC: 15.5 10*3/uL — ABNORMAL HIGH (ref 4.0–10.5)
nRBC: 0 % (ref 0.0–0.2)

## 2019-09-04 LAB — COMPREHENSIVE METABOLIC PANEL
ALT: 26 U/L (ref 0–44)
AST: 22 U/L (ref 15–41)
Albumin: 4.4 g/dL (ref 3.5–5.0)
Alkaline Phosphatase: 70 U/L (ref 38–126)
Anion gap: 10 (ref 5–15)
BUN: 15 mg/dL (ref 6–20)
CO2: 24 mmol/L (ref 22–32)
Calcium: 9.8 mg/dL (ref 8.9–10.3)
Chloride: 107 mmol/L (ref 98–111)
Creatinine, Ser: 0.65 mg/dL (ref 0.44–1.00)
GFR calc Af Amer: >60 mL/min (ref >60)
GFR calc non Af Amer: >60 mL/min (ref >60)
Glucose, Bld: 159 mg/dL — ABNORMAL HIGH (ref 70–99)
Potassium: 3.7 mmol/L (ref 3.5–5.1)
Sodium: 141 mmol/L (ref 135–145)
Total Bilirubin: 0.9 mg/dL (ref 0.3–1.2)
Total Protein: 7.4 g/dL (ref 6.5–8.1)

## 2019-09-04 LAB — CBG MONITORING, ED: Glucose-Capillary: 150 mg/dL — ABNORMAL HIGH (ref 70–99)

## 2019-09-04 MED ORDER — SODIUM CHLORIDE 0.9 % IV BOLUS
1000.0000 mL | Freq: Once | INTRAVENOUS | Status: AC
  Administered 2019-09-04: 1000 mL via INTRAVENOUS

## 2019-09-04 MED ORDER — SODIUM CHLORIDE 0.9 % IV BOLUS
1000.0000 mL | Freq: Once | INTRAVENOUS | Status: AC
  Administered 2019-09-05: 1000 mL via INTRAVENOUS

## 2019-09-04 MED ORDER — HALOPERIDOL LACTATE 5 MG/ML IJ SOLN
5.0000 mg | Freq: Once | INTRAMUSCULAR | Status: AC
  Administered 2019-09-04: 5 mg via INTRAVENOUS
  Filled 2019-09-04: qty 1

## 2019-09-04 MED ORDER — PROMETHAZINE HCL 25 MG/ML IJ SOLN
12.5000 mg | Freq: Once | INTRAMUSCULAR | Status: AC
  Administered 2019-09-04: 12.5 mg via INTRAVENOUS
  Filled 2019-09-04: qty 1

## 2019-09-04 NOTE — ED Provider Notes (Signed)
[place] DEPT Provider Note   CSN: [id number] Arrival date & time: 09/04/19  2020     History   Chief Complaint Chief Complaint  Patient presents with  . Weakness  . Emesis    HPI Jacqueline Coleman is a 58 y.o. female.     58 year old female with history of insulin-dependent diabetes, rheumatoid arthritis on methotrexate presents with complaint of nausea and vomiting onset this morning.  Patient is an insulin-dependent diabetic, admitted to [facility] on September 12 for DKA, discharged same day.  Patient denies sick contacts, recent travel, changes in bowel or bladder habits or abdominal pain.  Patient reports feeling very sweaty with vomiting which causes her to have chills otherwise denies fevers.  Patient reports highest blood sugar of 249, used a urine ketone strip at home which was positive.  Reports feeling incredibly thirsty.  Patient has taken Zofran without relief of her nausea or vomiting.  Patient does smoke marijuana.  Denies any other complaints or concerns.     Past Medical History:  Diagnosis Date  . Ankylosing spondylitis ([place])   . Diabetes mellitus without complication ([place])   . Osteoporosis   . Rheumatoid arthritis [facility])     Patient Active Problem List   Diagnosis Date Noted  . DKA (diabetic ketoacidoses) ([place]) 08/20/2019  . Tobacco dependence 08/20/2019  . Marijuana abuse 08/20/2019  . Rheumatoid arthritis ([place])   . Ankylosing spondylitis ([place])     Past Surgical History:  Procedure Laterality Date  . CARPAL TUNNEL RELEASE Right 2003  . CESAREAN SECTION       OB History   No obstetric history on file.      Home Medications    Prior to Admission medications   Medication Sig Start Date End Date Taking? Authorizing Provider  estradiol (ESTRACE) 2 MG tablet Take 2 mg by mouth every other day. 02/28/19   [provider]  finasteride (PROSCAR) 5 MG tablet Take 5 mg by mouth every other day. 08/06/19    [provider]  folic acid (FOLVITE) 1 MG tablet Take 1 tablet by mouth at bedtime. 08/04/19   [provider]  gabapentin (NEURONTIN) 100 MG capsule Take 100 mg by mouth daily. 07/21/19   [provider]  ketoconazole (NIZORAL) 2 % shampoo Apply 1 application topically daily. 08/11/19   [provider]  LANTUS SOLOSTAR 100 UNIT/ML Solostar Pen Inject 15 Units into the skin at bedtime.  07/21/19   [provider]  methotrexate (RHEUMATREX) 2.5 MG tablet Take 25 mg by mouth every Thursday. 08/04/19   [provider]  ondansetron (ZOFRAN ODT) 4 MG disintegrating tablet 4mg  ODT q4 hours prn nausea/vomit 09/05/19   Tacy Learn, PA-C  progesterone (PROMETRIUM) 100 MG capsule Take 100 mg by mouth every other day. 02/28/19   [provider]  PROMETHEGAN 25 MG suppository Place 1 suppository rectally every 6 (six) hours as needed for nausea or vomiting.  08/17/19   [provider]  spironolactone (ALDACTONE) 100 MG tablet Take 100 mg by mouth 2 (two) times daily. 08/06/19   [provider]  XIGDUO XR 10-500 MG TB24 Take 1 tablet by mouth daily. 08/11/19   [provider]    Family History Family History  Problem Relation Age of Onset  . Breast cancer Neg Hx     Social History Social History   Tobacco Use  . Smoking status: Current Every Day Smoker    Packs/day: 0.75  Years: 30.00    Pack years: 22.50  . Smokeless tobacco: Never Used  Substance Use Topics  . Alcohol use: Not Currently  . Drug use: Yes    Types: Marijuana    Comment: last use a week ago     Allergies   Codeine, Lisinopril, and Prednisone   Review of Systems Review of Systems  Constitutional: Positive for chills and diaphoresis. Negative for fever.  Respiratory: Negative for shortness of breath.   Cardiovascular: Negative for chest pain.  Gastrointestinal: Positive for nausea and vomiting. Negative for abdominal pain, blood in stool,  constipation and diarrhea.  Endocrine: Positive for polydipsia and polyuria.  Genitourinary: Positive for frequency. Negative for dysuria.  Musculoskeletal: Negative for arthralgias and myalgias.  Skin: Negative for rash and wound.  Allergic/Immunologic: Positive for immunocompromised state.  Neurological: Negative for weakness.  Psychiatric/Behavioral: Negative for confusion.  All other systems reviewed and are negative.    Physical Exam Updated Vital Signs BP (!) 138/51   Pulse 78   Temp 97.8 F (36.6 C) (Oral)   Resp 20   Ht 5\' 3"  (1.6 m)   Wt 86.2 kg   SpO2 100%   BMI 33.66 kg/m   Physical Exam Vitals signs and nursing note reviewed.  Constitutional:      General: She is not in acute distress.    Appearance: She is well-developed. She is diaphoretic.  HENT:     Head: Normocephalic and atraumatic.     Mouth/Throat:     Mouth: Mucous membranes are dry.  Cardiovascular:     Rate and Rhythm: Normal rate and regular rhythm.     Pulses: Normal pulses.     Heart sounds: Normal heart sounds.  Pulmonary:     Effort: Pulmonary effort is normal.     Breath sounds: Normal breath sounds.  Abdominal:     Palpations: Abdomen is soft.     Tenderness: There is no abdominal tenderness.  Skin:    General: Skin is warm.     Findings: No erythema or rash.  Neurological:     Mental Status: She is alert and oriented to person, place, and time.  Psychiatric:        Behavior: Behavior normal.      ED Treatments / Results  Labs (all labs ordered are listed, but only abnormal results are displayed) Labs Reviewed  CBC WITH DIFFERENTIAL/PLATELET - Abnormal; Notable for the following components:      Result Value   WBC 15.5 (*)    RBC 5.66 (*)    Hemoglobin 17.0 (*)    HCT 51.0 (*)    Neutro Abs 12.8 (*)    Abs Immature Granulocytes 0.08 (*)    All other components within normal limits  COMPREHENSIVE METABOLIC PANEL - Abnormal; Notable for the following components:    Glucose, Bld 159 (*)    All other components within normal limits  CBG MONITORING, ED - Abnormal; Notable for the following components:   Glucose-Capillary 150 (*)    All other components within normal limits  LIPASE, BLOOD  URINALYSIS, ROUTINE W REFLEX MICROSCOPIC    EKG EKG Interpretation  Date/Time:  Sunday September 04 2019 20:44:20 EDT Ventricular Rate:  67 PR Interval:    QRS Duration: 103 QT Interval:  422 QTC Calculation: 446 R Axis:   39 Text Interpretation:  Sinus rhythm Atrial premature complex Low voltage, precordial leads Borderline T abnormalities, anterior leads No previous tracing Confirmed by Gwyneth Sprout ([id number]) on 09/04/2019 10:33:43 PM  Radiology No results found.  Procedures Procedures (including critical care time)  Medications Ordered in ED Medications  sodium chloride 0.9 % bolus 1,000 mL (0 mLs Intravenous Stopped 09/04/19 2322)  promethazine (PHENERGAN) injection 12.5 mg (12.5 mg Intravenous Given 09/04/19 2148)  haloperidol lactate (HALDOL) injection 5 mg (5 mg Intravenous Given 09/04/19 2327)  sodium chloride 0.9 % bolus 1,000 mL (1,000 mLs Intravenous New Bag/Given 09/05/19 0002)     Initial Impression / Assessment and Plan / ED Course  I have reviewed the triage vital signs and the nursing notes.  Pertinent labs & imaging results that were available during my care of the patient were reviewed by me and considered in my medical decision making (see chart for details).  Clinical Course as of Sep 04 44  Wynelle Link Sep 04, 2019  [id number] 58 year old female presents with complaint of severe nausea and vomiting.  Patient reports history of similar earlier this month and was admitted to the hospital for DKA, suspect same today.  Denies abdominal pain.  On exam patient appears to feel unwell, is diaphoretic, abdomen is soft and nontender.  Patient was given Zofran which did not help with her vomiting, then given Phenergan again without relief.  Review of  records, patient reports using marijuana, she was given Haldol with improvement.  Patient is requesting something to drink, will p.o. challenge her. Reviewed results with patient, discussed possible cannabis hyperemesis, patient does not agree with this suggestion, states it has never been a problem in the past.   [LM]  Mon Sep 05, 2019  0003 Review of lab work, lipase WNL, CBC with elevated WBC to 15.5 possibly secondary to vomiting. CMP with glucose of 159, Co2 and gap normal.    [LM]    Clinical Course User Index [LM] Jeannie Fend, PA-C      Final Clinical Impressions(s) / ED Diagnoses   Final diagnoses:  Non-intractable vomiting with nausea, unspecified vomiting type  Marijuana use    ED Discharge Orders         Ordered    ondansetron (ZOFRAN ODT) 4 MG disintegrating tablet     09/05/19 0028           Jeannie Fend, PA-C 09/05/19 Sibyl Parr, MD 09/07/19 [phone]

## 2019-09-04 NOTE — ED Triage Notes (Signed)
Per EMS - Pt coming from home with CC of N/V and weakness x 5 hours. Pt was hospitalized approx 2 weeks ago at [facility] for DKA. Current CBG 145.  COVID Neg 2 weeks ago.   138 78 63 HR 99% RA  97.1 CBG 149

## 2019-09-05 MED ORDER — FAMOTIDINE IN NACL 20-0.9 MG/50ML-% IV SOLN
20.0000 mg | Freq: Once | INTRAVENOUS | Status: AC
  Administered 2019-09-05: 20 mg via INTRAVENOUS
  Filled 2019-09-05: qty 50

## 2019-09-05 MED ORDER — PROMETHAZINE HCL 25 MG RE SUPP: 25.0000 mg | Freq: Four times a day (QID) | RECTAL | 0 refills | Status: DC | PRN

## 2019-09-05 MED ORDER — METOCLOPRAMIDE HCL 10 MG PO TABS: 10.0000 mg | ORAL_TABLET | Freq: Four times a day (QID) | ORAL | 0 refills | Status: DC

## 2019-09-05 MED ORDER — ONDANSETRON 4 MG PO TBDP: ORAL_TABLET | ORAL | 0 refills | Status: DC

## 2019-09-05 NOTE — ED Provider Notes (Signed)
Patient signed out at end of shift by Suella Broad, PA-C.   The patient is resting on re-evaluation. She has been up to the bathroom without vomiting but reports persistent nausea. She is taking small amounts of fluids without vomiting.   She is requesting something for indigestion. IV pepcid ordered with improvement.   VSS. No further vomiting. The patient and husband report they are comfortable with discharge home. Return precautions discussed.      Charlann Lange, PA-C 09/05/19 [id number]    Ezequiel Essex, MD 09/05/19 [phone]

## 2019-09-05 NOTE — Discharge Instructions (Signed)
Home to rest. Push hydrating fluids. Use Zofran ODT as needed as prescribed for nausea and vomiting.  Marijuana use can aggravate vomiting, recommend discontinue use.

## 2019-09-15 DIAGNOSIS — E1169 Type 2 diabetes mellitus with other specified complication: Secondary | ICD-10-CM | POA: Diagnosis not present

## 2019-09-15 DIAGNOSIS — E6609 Other obesity due to excess calories: Secondary | ICD-10-CM | POA: Diagnosis not present

## 2019-09-15 DIAGNOSIS — E538 Deficiency of other specified B group vitamins: Secondary | ICD-10-CM | POA: Diagnosis not present

## 2019-09-15 DIAGNOSIS — Z720 Tobacco use: Secondary | ICD-10-CM | POA: Diagnosis not present

## 2019-09-16 DIAGNOSIS — E1169 Type 2 diabetes mellitus with other specified complication: Secondary | ICD-10-CM | POA: Diagnosis not present

## 2019-09-16 DIAGNOSIS — R109 Unspecified abdominal pain: Secondary | ICD-10-CM | POA: Diagnosis not present

## 2019-09-22 ENCOUNTER — Other Ambulatory Visit: Payer: Self-pay | Admitting: Obstetrics and Gynecology

## 2019-09-22 DIAGNOSIS — Z1231 Encounter for screening mammogram for malignant neoplasm of breast: Secondary | ICD-10-CM

## 2019-09-29 DIAGNOSIS — Z23 Encounter for immunization: Secondary | ICD-10-CM | POA: Diagnosis not present

## 2019-09-29 DIAGNOSIS — E1169 Type 2 diabetes mellitus with other specified complication: Secondary | ICD-10-CM | POA: Diagnosis not present

## 2019-10-13 DIAGNOSIS — M2550 Pain in unspecified joint: Secondary | ICD-10-CM | POA: Diagnosis not present

## 2019-10-13 DIAGNOSIS — M549 Dorsalgia, unspecified: Secondary | ICD-10-CM | POA: Diagnosis not present

## 2019-10-13 DIAGNOSIS — M791 Myalgia, unspecified site: Secondary | ICD-10-CM | POA: Diagnosis not present

## 2019-10-13 DIAGNOSIS — M459 Ankylosing spondylitis of unspecified sites in spine: Secondary | ICD-10-CM | POA: Diagnosis not present

## 2019-10-31 DIAGNOSIS — Z6833 Body mass index (BMI) 33.0-33.9, adult: Secondary | ICD-10-CM | POA: Diagnosis not present

## 2019-10-31 DIAGNOSIS — M545 Low back pain, unspecified: Secondary | ICD-10-CM | POA: Insufficient documentation

## 2019-11-14 ENCOUNTER — Other Ambulatory Visit: Payer: Self-pay | Admitting: Physician Assistant

## 2019-11-14 DIAGNOSIS — M5440 Lumbago with sciatica, unspecified side: Secondary | ICD-10-CM

## 2019-11-14 DIAGNOSIS — G8929 Other chronic pain: Secondary | ICD-10-CM

## 2019-11-19 ENCOUNTER — Ambulatory Visit
Admission: RE | Admit: 2019-11-19 | Discharge: 2019-11-19 | Disposition: A | Discharge status: Home / Self Care | Payer: BC Managed Care – PPO | Source: Ambulatory Visit | Attending: Physician Assistant | Admitting: Physician Assistant

## 2019-11-19 ENCOUNTER — Other Ambulatory Visit: Payer: Self-pay

## 2019-11-19 DIAGNOSIS — M48061 Spinal stenosis, lumbar region without neurogenic claudication: Secondary | ICD-10-CM | POA: Diagnosis not present

## 2019-11-19 DIAGNOSIS — G8929 Other chronic pain: Secondary | ICD-10-CM

## 2019-11-19 DIAGNOSIS — M5440 Lumbago with sciatica, unspecified side: Secondary | ICD-10-CM

## 2019-11-21 DIAGNOSIS — Z6834 Body mass index (BMI) 34.0-34.9, adult: Secondary | ICD-10-CM | POA: Diagnosis not present

## 2019-11-21 DIAGNOSIS — M545 Low back pain: Secondary | ICD-10-CM | POA: Diagnosis not present

## 2019-12-15 DIAGNOSIS — M25539 Pain in unspecified wrist: Secondary | ICD-10-CM | POA: Diagnosis not present

## 2019-12-15 DIAGNOSIS — M79643 Pain in unspecified hand: Secondary | ICD-10-CM | POA: Diagnosis not present

## 2019-12-15 DIAGNOSIS — M549 Dorsalgia, unspecified: Secondary | ICD-10-CM | POA: Diagnosis not present

## 2019-12-15 DIAGNOSIS — M2550 Pain in unspecified joint: Secondary | ICD-10-CM | POA: Diagnosis not present

## 2019-12-16 DIAGNOSIS — E119 Type 2 diabetes mellitus without complications: Secondary | ICD-10-CM | POA: Diagnosis not present

## 2019-12-23 DIAGNOSIS — L649 Androgenic alopecia, unspecified: Secondary | ICD-10-CM | POA: Diagnosis not present

## 2019-12-23 DIAGNOSIS — L650 Telogen effluvium: Secondary | ICD-10-CM | POA: Diagnosis not present

## 2019-12-23 DIAGNOSIS — L659 Nonscarring hair loss, unspecified: Secondary | ICD-10-CM | POA: Diagnosis not present

## 2019-12-23 DIAGNOSIS — Z79899 Other long term (current) drug therapy: Secondary | ICD-10-CM | POA: Diagnosis not present

## 2020-01-31 DIAGNOSIS — E538 Deficiency of other specified B group vitamins: Secondary | ICD-10-CM | POA: Diagnosis not present

## 2020-01-31 DIAGNOSIS — E1169 Type 2 diabetes mellitus with other specified complication: Secondary | ICD-10-CM | POA: Diagnosis not present

## 2020-01-31 DIAGNOSIS — E6609 Other obesity due to excess calories: Secondary | ICD-10-CM | POA: Diagnosis not present

## 2020-01-31 DIAGNOSIS — R140 Abdominal distension (gaseous): Secondary | ICD-10-CM | POA: Diagnosis not present

## 2020-02-06 ENCOUNTER — Telehealth: Payer: Self-pay | Admitting: Nutrition

## 2020-02-06 NOTE — Telephone Encounter (Signed)
Patient called to cancel her appointment with me for tomorrow, saying that she does not have her pump, and does not need to see me.  She says she is a patient of Dr. Talmage Nap.  I explained that I would show her all of the pump and discuss with her how each one works, and she can decide which one she wants.  We can then start the insurance investigation paperwork for that particular pump.  She did not want to come in saying she has already pick the pump that goes with the Dexcom, and that Dr. Talmage Nap has already done this.   She says she has not heard from her insurance, and not sure what has been happening this past week.  She also says she has a call in to Dr. Willeen Cass office to see what has been done so far.   I told her that if Dr Talmage Nap has already order this pump for her, she can call the pump representative to see where the paperwork is at, as far as insurance authorization and her cost.  She took his name and number.  I offered to do this for her, but says she wants to talk to D.r Balan's office first.  I agreed to do the paperwork for her to start this process if Dr. Willeen Cass office has not done this as yet.  She voiced understanding of this.  I also explained that the appointment with me today and for the pump start is no cost to her.  That this is all covered by the pump company.  She voiced understanding of this as well, and refused her appointment with me tomorrow, saying she will come when she gets the pump.

## 2020-02-07 ENCOUNTER — Ambulatory Visit: Payer: BC Managed Care – PPO | Admitting: Nutrition

## 2020-02-08 ENCOUNTER — Telehealth: Payer: Self-pay | Admitting: Nutrition

## 2020-02-08 NOTE — Telephone Encounter (Signed)
Patient reported that she spoke with her Doctor's office, who told her talk with [employer].  She reports having done this.  She says she did not need any assistance from me, and that she will let me know if/when she gets her pump, and, if/when she wants me to train her.

## 2020-02-18 ENCOUNTER — Ambulatory Visit: Payer: BC Managed Care – PPO | Attending: Internal Medicine

## 2020-02-18 DIAGNOSIS — Z23 Encounter for immunization: Secondary | ICD-10-CM

## 2020-02-18 NOTE — Progress Notes (Signed)
   Covid-19 Vaccination Clinic  Name:  Jacqueline Coleman    MRN: [id number] DOB: 06/21/1961  02/18/2020  [name] was observed post Covid-19 immunization for 15 minutes without incident. She was provided with Vaccine Information Sheet and instruction to access the V-Safe system.   [name] was instructed to call 911 with any severe reactions post vaccine: Marland Kitchen Difficulty breathing  . Swelling of face and throat  . A fast heartbeat  . A bad rash all over body  . Dizziness and weakness   Immunizations Administered    Name Date Dose VIS Date Route   Pfizer COVID-19 Vaccine 02/18/2020  8:34 AM 0.3 mL 11/18/2019 Intramuscular   Manufacturer: [employer], [employer]   Lot: [id number]   NDC: [address]-2

## 2020-02-26 DIAGNOSIS — Z20822 Contact with and (suspected) exposure to covid-19: Secondary | ICD-10-CM | POA: Diagnosis not present

## 2020-02-26 DIAGNOSIS — J309 Allergic rhinitis, unspecified: Secondary | ICD-10-CM | POA: Diagnosis not present

## 2020-02-27 DIAGNOSIS — Z794 Long term (current) use of insulin: Secondary | ICD-10-CM | POA: Diagnosis not present

## 2020-03-12 ENCOUNTER — Ambulatory Visit: Payer: BC Managed Care – PPO | Attending: Internal Medicine

## 2020-03-12 ENCOUNTER — Ambulatory Visit: Payer: BC Managed Care – PPO

## 2020-03-12 DIAGNOSIS — Z23 Encounter for immunization: Secondary | ICD-10-CM

## 2020-03-14 DIAGNOSIS — M25531 Pain in right wrist: Secondary | ICD-10-CM | POA: Diagnosis not present

## 2020-03-14 DIAGNOSIS — Z79899 Other long term (current) drug therapy: Secondary | ICD-10-CM | POA: Diagnosis not present

## 2020-03-14 DIAGNOSIS — M0579 Rheumatoid arthritis with rheumatoid factor of multiple sites without organ or systems involvement: Secondary | ICD-10-CM | POA: Diagnosis not present

## 2020-03-14 DIAGNOSIS — M25539 Pain in unspecified wrist: Secondary | ICD-10-CM | POA: Diagnosis not present

## 2020-03-14 DIAGNOSIS — M79643 Pain in unspecified hand: Secondary | ICD-10-CM | POA: Diagnosis not present

## 2020-04-03 DIAGNOSIS — E1169 Type 2 diabetes mellitus with other specified complication: Secondary | ICD-10-CM | POA: Diagnosis not present

## 2020-04-03 DIAGNOSIS — Z9641 Presence of insulin pump (external) (internal): Secondary | ICD-10-CM | POA: Diagnosis not present

## 2020-04-17 DIAGNOSIS — M0579 Rheumatoid arthritis with rheumatoid factor of multiple sites without organ or systems involvement: Secondary | ICD-10-CM | POA: Diagnosis not present

## 2020-04-25 DIAGNOSIS — M0579 Rheumatoid arthritis with rheumatoid factor of multiple sites without organ or systems involvement: Secondary | ICD-10-CM | POA: Diagnosis not present

## 2020-04-25 DIAGNOSIS — M79643 Pain in unspecified hand: Secondary | ICD-10-CM | POA: Diagnosis not present

## 2020-04-25 DIAGNOSIS — M25539 Pain in unspecified wrist: Secondary | ICD-10-CM | POA: Diagnosis not present

## 2020-04-25 DIAGNOSIS — Z79899 Other long term (current) drug therapy: Secondary | ICD-10-CM | POA: Diagnosis not present

## 2020-05-15 DIAGNOSIS — M0579 Rheumatoid arthritis with rheumatoid factor of multiple sites without organ or systems involvement: Secondary | ICD-10-CM | POA: Diagnosis not present

## 2020-06-06 DIAGNOSIS — E1169 Type 2 diabetes mellitus with other specified complication: Secondary | ICD-10-CM | POA: Diagnosis not present

## 2020-07-10 DIAGNOSIS — M0579 Rheumatoid arthritis with rheumatoid factor of multiple sites without organ or systems involvement: Secondary | ICD-10-CM | POA: Diagnosis not present

## 2020-07-17 DIAGNOSIS — E1169 Type 2 diabetes mellitus with other specified complication: Secondary | ICD-10-CM | POA: Diagnosis not present

## 2020-07-17 DIAGNOSIS — D751 Secondary polycythemia: Secondary | ICD-10-CM | POA: Diagnosis not present

## 2020-07-17 DIAGNOSIS — Z79899 Other long term (current) drug therapy: Secondary | ICD-10-CM | POA: Diagnosis not present

## 2020-07-17 DIAGNOSIS — Z9641 Presence of insulin pump (external) (internal): Secondary | ICD-10-CM | POA: Diagnosis not present

## 2020-07-24 ENCOUNTER — Telehealth: Payer: Self-pay | Admitting: Hematology and Oncology

## 2020-07-24 NOTE — Telephone Encounter (Signed)
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 Ms. Jacqueline Coleman has been cld and scheduled to see Dr. Leonides Schanz on 9/8 at 9am. Pt aware to arrive 15 minutes early.

## 2020-07-31 DIAGNOSIS — M25539 Pain in unspecified wrist: Secondary | ICD-10-CM | POA: Diagnosis not present

## 2020-07-31 DIAGNOSIS — M79643 Pain in unspecified hand: Secondary | ICD-10-CM | POA: Diagnosis not present

## 2020-07-31 DIAGNOSIS — Z79899 Other long term (current) drug therapy: Secondary | ICD-10-CM | POA: Diagnosis not present

## 2020-07-31 DIAGNOSIS — M0579 Rheumatoid arthritis with rheumatoid factor of multiple sites without organ or systems involvement: Secondary | ICD-10-CM | POA: Diagnosis not present

## 2020-08-15 ENCOUNTER — Inpatient Hospital Stay: Payer: BC Managed Care – PPO

## 2020-08-15 ENCOUNTER — Inpatient Hospital Stay: Payer: BC Managed Care – PPO | Attending: Hematology and Oncology | Admitting: Hematology and Oncology

## 2020-08-15 ENCOUNTER — Other Ambulatory Visit: Payer: Self-pay

## 2020-08-15 VITALS — BP 115/52 | HR 54 | Temp 97.8°F | Resp 17 | Ht 63.0 in | Wt 211.3 lb

## 2020-08-15 DIAGNOSIS — F17200 Nicotine dependence, unspecified, uncomplicated: Secondary | ICD-10-CM | POA: Diagnosis not present

## 2020-08-15 DIAGNOSIS — D751 Secondary polycythemia: Secondary | ICD-10-CM

## 2020-08-15 DIAGNOSIS — E119 Type 2 diabetes mellitus without complications: Secondary | ICD-10-CM | POA: Insufficient documentation

## 2020-08-15 DIAGNOSIS — Z794 Long term (current) use of insulin: Secondary | ICD-10-CM | POA: Insufficient documentation

## 2020-08-15 DIAGNOSIS — M069 Rheumatoid arthritis, unspecified: Secondary | ICD-10-CM | POA: Insufficient documentation

## 2020-08-15 DIAGNOSIS — D72829 Elevated white blood cell count, unspecified: Secondary | ICD-10-CM | POA: Insufficient documentation

## 2020-08-15 DIAGNOSIS — F17210 Nicotine dependence, cigarettes, uncomplicated: Secondary | ICD-10-CM | POA: Diagnosis not present

## 2020-08-15 DIAGNOSIS — Z79899 Other long term (current) drug therapy: Secondary | ICD-10-CM | POA: Insufficient documentation

## 2020-08-15 LAB — CMP (CANCER CENTER ONLY)
ALT: 21 U/L (ref 0–44)
AST: 14 U/L — ABNORMAL LOW (ref 15–41)
Albumin: 3.6 g/dL (ref 3.5–5.0)
Alkaline Phosphatase: 83 U/L (ref 38–126)
Anion gap: 8 (ref 5–15)
BUN: 6 mg/dL (ref 6–20)
CO2: 26 mmol/L (ref 22–32)
Calcium: 9.5 mg/dL (ref 8.9–10.3)
Chloride: 104 mmol/L (ref 98–111)
Creatinine: 0.74 mg/dL (ref 0.44–1.00)
GFR, Est AFR Am: >60 mL/min (ref >60)
GFR, Estimated: >60 mL/min (ref >60)
Glucose, Bld: 138 mg/dL — ABNORMAL HIGH (ref 70–99)
Potassium: 4.2 mmol/L (ref 3.5–5.1)
Sodium: 138 mmol/L (ref 135–145)
Total Bilirubin: 0.3 mg/dL (ref 0.3–1.2)
Total Protein: 7.1 g/dL (ref 6.5–8.1)

## 2020-08-15 LAB — CBC WITH DIFFERENTIAL (CANCER CENTER ONLY)
Abs Immature Granulocytes: 0.12 10*3/uL — ABNORMAL HIGH (ref 0.00–0.07)
Basophils Absolute: 0.1 10*3/uL (ref 0.0–0.1)
Basophils Relative: 1 %
Eosinophils Absolute: 0.1 10*3/uL (ref 0.0–0.5)
Eosinophils Relative: 1 %
HCT: 47.7 % — ABNORMAL HIGH (ref 36.0–46.0)
Hemoglobin: 15.8 g/dL — ABNORMAL HIGH (ref 12.0–15.0)
Immature Granulocytes: 1 %
Lymphocytes Relative: 29 %
Lymphs Abs: 3.2 10*3/uL (ref 0.7–4.0)
MCH: 29 pg (ref 26.0–34.0)
MCHC: 33.1 g/dL (ref 30.0–36.0)
MCV: 87.7 fL (ref 80.0–100.0)
Monocytes Absolute: 0.8 10*3/uL (ref 0.1–1.0)
Monocytes Relative: 7 %
Neutro Abs: 6.6 10*3/uL (ref 1.7–7.7)
Neutrophils Relative %: 61 %
Platelet Count: 230 10*3/uL (ref 150–400)
RBC: 5.44 MIL/uL — ABNORMAL HIGH (ref 3.87–5.11)
RDW: 12.7 % (ref 11.5–15.5)
WBC Count: 10.9 10*3/uL — ABNORMAL HIGH (ref 4.0–10.5)
nRBC: 0 % (ref 0.0–0.2)

## 2020-08-15 LAB — RETIC PANEL
Immature Retic Fract: 6.6 % (ref 2.3–15.9)
RBC.: 5.37 MIL/uL — ABNORMAL HIGH (ref 3.87–5.11)
Retic Count, Absolute: 77.3 10*3/uL (ref 19.0–186.0)
Retic Ct Pct: 1.4 % (ref 0.4–3.1)
Reticulocyte Hemoglobin: 35 pg (ref >27.9)

## 2020-08-15 LAB — TSH: TSH: 1.311 u[IU]/mL (ref 0.308–3.960)

## 2020-08-15 LAB — IRON AND TIBC
Iron: 63 ug/dL (ref 41–142)
Saturation Ratios: 20 % — ABNORMAL LOW (ref 21–57)
TIBC: 316 ug/dL (ref 236–444)
UIBC: 253 ug/dL (ref 120–384)

## 2020-08-15 LAB — FERRITIN: Ferritin: 182 ng/mL (ref 11–307)

## 2020-08-15 NOTE — Progress Notes (Signed)
[facility] [phone]   [phone]  INITIAL CONSULT NOTE  Patient Care Team: Tally Joe, MD as PCP - General (Family Medicine)  Hematological/Oncological History # Polycythemia # Leukocytosis 1)  08/19/2019: WBC 15.6, Hgb 19.2, Hct 55.7, Plt 355 2) 07/17/2020: WBC 11.4, Hgb 16.4, Hct 48.2, Plt 250 3) 08/15/2020: establish care with Dr. Leonides Schanz   CHIEF COMPLAINTS/PURPOSE OF CONSULTATION:  "Polycythemia "  HISTORY OF PRESENTING ILLNESS:  Jacqueline Coleman 59 y.o. female with medical history significant for ankylosing spondylitis, rheumatoid arthritis, DM type II (with episodes of DKA), and osteoporosis who presents for evaluation of longstanding polycythemia and leukocytosis.   On review of the previous records Jacqueline Coleman was noted to have a longstanding history of polycythemia and leukocytosis dating back to at least September 2020.  On August 19, 2019 the patient was noted to have a white blood cell count of 15.6, hemoglobin of 19.2, and a platelet count of 355.  More recently on 07/17/2020 patient was found to have white blood cell count of 11.4, hemoglobin 16.4, and a platelet count of 250.  Due to concern for this nonresolving polycythemia and leukocytosis the patient was referred to hematology for further evaluation and management.  On exam today Jacqueline Coleman notes that she has recently become aware of her elevations in her red blood cell count.  She notes that she does have some medical history that may contribute to this.  She has had issues with vitamin B-12 deficiency before in the past.  She also notes that she has a history of ankylosing spondylitis as well as rheumatoid arthritis.  She notes that she does periodically receive steroid injections and that she most recently received one last month.  On further discussion she also endorses being an active every day smoker smoking approximately 10 cigarettes/day.  She also smokes  marijuana in order to help with her pain control.  She does report a history of snoring and daytime somnolence.  She has never been evaluated previously for sleep apnea.  She is also expressing interest in quitting smoking.  In terms of family history she notes that "everyone has diabetes".  She also reports that her mother had breast cancer and a CVA in her 14s.  She also notes that her father had ESRD secondary to kidney damage from his uncontrolled diabetes.  Otherwise she currently denies having any issues with fevers, chills, sweats, nausea, vomiting or diarrhea.  A full 10 point ROS is listed below.  MEDICAL HISTORY:  Past Medical History:  Diagnosis Date  . Ankylosing spondylitis (HCC)   . Diabetes mellitus without complication (HCC)   . Osteoporosis   . Rheumatoid arthritis (HCC)     SURGICAL HISTORY: Past Surgical History:  Procedure Laterality Date  . CARPAL TUNNEL RELEASE Right 2003  . CESAREAN SECTION      SOCIAL HISTORY: Social History   Socioeconomic History  . Marital status: Married    Spouse name: Not on file  . Number of children: Not on file  . Years of education: Not on file  . Highest education level: Not on file  Occupational History  . Occupation: retired  Tobacco Use  . Smoking status: Current Every Day Smoker    Packs/day: 0.75    Years: 30.00    Pack years: 22.50  . Smokeless tobacco: Never Used  Substance and Sexual Activity  . Alcohol use: Not Currently  . Drug use: Yes    Types: Marijuana    Comment: last  use a week ago  . Sexual activity: Never  Other Topics Concern  . Not on file  Social History Narrative  . Not on file   Social Determinants of Health   Financial Resource Strain:   . Difficulty of Paying Living Expenses: Not on file  Food Insecurity:   . Worried About Programme researcher, broadcasting/film/video in the Last Year: Not on file  . Ran Out of Food in the Last Year: Not on file  Transportation Needs:   . Lack of Transportation (Medical): Not on  file  . Lack of Transportation (Non-Medical): Not on file  Physical Activity:   . Days of Exercise per Week: Not on file  . Minutes of Exercise per Session: Not on file  Stress:   . Feeling of Stress : Not on file  Social Connections:   . Frequency of Communication with Friends and Family: Not on file  . Frequency of Social Gatherings with Friends and Family: Not on file  . Attends Religious Services: Not on file  . Active Member of Clubs or Organizations: Not on file  . Attends Banker Meetings: Not on file  . Marital Status: Not on file  Intimate Partner Violence:   . Fear of Current or Ex-Partner: Not on file  . Emotionally Abused: Not on file  . Physically Abused: Not on file  . Sexually Abused: Not on file    FAMILY HISTORY: Family History  Problem Relation Age of Onset  . Breast cancer Neg Hx     ALLERGIES:  is allergic to methotrexate, adhesive [tape], codeine, lisinopril, losartan, and prednisone.  MEDICATIONS:  Current Outpatient Medications  Medication Sig Dispense Refill  . Continuous Blood Gluc Sensor (DEXCOM G6 SENSOR) MISC SMARTSIG:1 Each Topical Every 10 Days    . Continuous Blood Gluc Transmit (DEXCOM G6 TRANSMITTER) MISC     . estradiol (ESTRACE) 2 MG tablet Take 2 mg by mouth every other day.    . finasteride (PROSCAR) 5 MG tablet Take 5 mg by mouth every other day.    . gabapentin (NEURONTIN) 100 MG capsule Take 100 mg by mouth daily.    Marland Kitchen HUMALOG 100 UNIT/ML injection Inject into the skin.    Marland Kitchen HUMIRA PEN 40 MG/0.4ML PNKT SMARTSIG:40 Milligram(s) SUB-Q Every 2 Weeks    . ketoconazole (NIZORAL) 2 % shampoo Apply 1 application topically daily.    Marland Kitchen leflunomide (ARAVA) 10 MG tablet Take 10 mg by mouth daily.    . progesterone (PROMETRIUM) 100 MG capsule Take 100 mg by mouth every other day.    . progesterone (PROMETRIUM) 100 MG capsule Take 100 mg by mouth at bedtime.    Marland Kitchen spironolactone (ALDACTONE) 100 MG tablet Take 100 mg by mouth 2 (two)  times daily.     No current facility-administered medications for this visit.    REVIEW OF SYSTEMS:   Constitutional: ( - ) fevers, ( - )  chills , ( - ) night sweats Eyes: ( - ) blurriness of vision, ( - ) double vision, ( - ) watery eyes Ears, nose, mouth, throat, and face: ( - ) mucositis, ( - ) sore throat Respiratory: ( - ) cough, ( - ) dyspnea, ( - ) wheezes Cardiovascular: ( - ) palpitation, ( - ) chest discomfort, ( - ) lower extremity swelling Gastrointestinal:  ( - ) nausea, ( - ) heartburn, ( - ) change in bowel habits Skin: ( - ) abnormal skin rashes Lymphatics: ( - ) new lymphadenopathy, ( - )  easy bruising Neurological: ( - ) numbness, ( - ) tingling, ( - ) new weaknesses Behavioral/Psych: ( - ) mood change, ( - ) new changes  All other systems were reviewed with the patient and are negative.  PHYSICAL EXAMINATION:  Vitals:   08/15/20 0925  BP: (!) 115/52  Pulse: (!) 54  Resp: 17  Temp: 97.8 F (36.6 C)  SpO2: 100%   Filed Weights   08/15/20 0925  Weight: 211 lb 4.8 oz (95.8 kg)    GENERAL: well appearing middle aged Caucasian female in NAD  SKIN: skin color, texture, turgor are normal, no rashes or significant lesions EYES: conjunctiva are pink and non-injected, sclera clear LUNGS: clear to auscultation and percussion with normal breathing effort HEART: regular rate & rhythm and no murmurs and no lower extremity edema ABDOMEN: soft, non-tender, non-distended, normal bowel sounds Musculoskeletal: no cyanosis of digits and no clubbing  PSYCH: alert & oriented x 3, fluent speech NEURO: no focal motor/sensory deficits  LABORATORY DATA:  I have reviewed the data as listed CBC Latest Ref Rng & Units 08/15/2020 09/04/2019 08/20/2019  WBC 4.0 - 10.5 K/uL 10.9(H) 15.5(H) 13.0(H)  Hemoglobin 12.0 - 15.0 g/dL 15.8(H) 17.0(H) 17.4(H)  Hematocrit 36 - 46 % 47.7(H) 51.0(H) 49.8(H)  Platelets 150 - 400 K/uL 230 298 284    CMP Latest Ref Rng & Units 08/15/2020 09/04/2019  08/20/2019  Glucose 70 - 99 mg/dL 992(E) 268(T) 419(Q)  BUN 6 - 20 mg/dL 6 15 18   Creatinine 0.44 - 1.00 mg/dL 2.22 9.79  Sodium 135 - 145 mmol/L 138 141 135  Potassium 3.5 - 5.1 mmol/L 4.2 3.7 3.4(L)  Chloride 98 - 111 mmol/L 104 107 104  CO2 22 - 32 mmol/L 26 24 24   Calcium 8.9 - 10.3 mg/dL 9.5 9.8 8.92)  Total Protein 6.5 - 8.1 g/dL 7.1 7.4 -  Total Bilirubin 0.3 - 1.2 mg/dL 0.3 0.9 -  Alkaline Phos 38 - 126 U/L 83 70 -  AST 15 - 41 U/L 14(L) 22 -  ALT 0 - 44 U/L 21 26 -    RADIOGRAPHIC STUDIES: No results found.  ASSESSMENT & PLAN Jacqueline Coleman 59 y.o. female with medical history significant for ankylosing spondylitis, rheumatoid arthritis, DM type II (with episodes of DKA), and osteoporosis who presents for evaluation of longstanding polycythemia and leukocytosis.  After review the labs, review the records, discussion with the patient the findings are most consistent with a secondary polycythemia and leukocytosis secondary to smoking and possible obstructive sleep apnea.  She has no signs or symptoms of would be concerning for a myeloproliferative neoplasm at this time.  In order to effectively rule this out we will order erythropoietin, iron levels, as well as a TSH.  We will order genetic panels with JAK-2 as well as BCR able.  In the event the patient is not found to have an MPN we will have her follow-up in 6 months time to assure she has established with sleep medicine and has made progress with smoking cessation.   #Polycythemia, Likely Secondary # Leukocytosis --findings are most consistent with a secondary polycythemia from smoking and OSA.  --recommend smoking cessation. Will refer to sleep medicine for consideration of a sleep study --additionally will order EPO, iron panel, and TSH to assure that the patient's polycythemia is secondary --JAK2 w/ reflex and BCR/ABL ordered to r/o MPN --RTC in 6 months or sooner if MPN evaluation is required.   Orders Placed  This Encounter  Procedures  .  CBC with Differential (Cancer Center Only)    Standing Status:   Future    Number of Occurrences:   1    Standing Expiration Date:   08/15/2021  . Retic Panel    Standing Status:   Future    Number of Occurrences:   1    Standing Expiration Date:   08/15/2021  . CMP (Cancer Center only)    Standing Status:   Future    Number of Occurrences:   1    Standing Expiration Date:   08/15/2021  . Iron and TIBC    Standing Status:   Future    Number of Occurrences:   1    Standing Expiration Date:   08/15/2021  . Ferritin    Standing Status:   Future    Number of Occurrences:   1    Standing Expiration Date:   08/15/2021  . TSH    Standing Status:   Future    Number of Occurrences:   1    Standing Expiration Date:   08/15/2021  . Erythropoietin    Standing Status:   Future    Number of Occurrences:   1    Standing Expiration Date:   08/15/2021  . JAK2 (INCLUDING V617F AND EXON 12), MPL,& CALR W/RFL MPN PANEL (NGS)    Standing Status:   Future    Number of Occurrences:   1    Standing Expiration Date:   08/15/2021  . BCR ABL1 FISH ([facility])    Standing Status:   Future    Number of Occurrences:   1    Standing Expiration Date:   08/15/2021  . Ambulatory referral to Sleep Studies    Referral Priority:   Routine    Referral Type:   Consultation    Referral Reason:   Specialty Services Required    Number of Visits Requested:   1    All questions were answered. The patient knows to call the clinic with any problems, questions or concerns.  A total of more than 60 minutes were spent on this encounter and over half of that time was spent on counseling and coordination of care as outlined above.   Ulysees Barns, MD Department of Hematology/Oncology [facility] Phone: [phone] Pager: [phone] Email: [email or web address]  08/15/2020 1:19 PM

## 2020-08-16 LAB — ERYTHROPOIETIN: Erythropoietin: 6 m[IU]/mL (ref 2.6–18.5)

## 2020-08-17 ENCOUNTER — Telehealth: Payer: Self-pay | Admitting: Hematology and Oncology

## 2020-08-17 NOTE — Telephone Encounter (Signed)
Scheduled per los. Called, not able to leave msg. Mailed printout  

## 2020-08-24 LAB — BCR ABL1 FISH (GENPATH)

## 2020-09-04 DIAGNOSIS — M0579 Rheumatoid arthritis with rheumatoid factor of multiple sites without organ or systems involvement: Secondary | ICD-10-CM | POA: Diagnosis not present

## 2020-09-05 ENCOUNTER — Encounter: Payer: Self-pay | Admitting: Hematology and Oncology

## 2020-09-05 DIAGNOSIS — E1169 Type 2 diabetes mellitus with other specified complication: Secondary | ICD-10-CM | POA: Diagnosis not present

## 2020-09-07 LAB — JAK2 (INCLUDING V617F AND EXON 12), MPL,& CALR W/RFL MPN PANEL (NGS)

## 2020-09-18 DIAGNOSIS — Z79899 Other long term (current) drug therapy: Secondary | ICD-10-CM | POA: Diagnosis not present

## 2020-09-18 DIAGNOSIS — L659 Nonscarring hair loss, unspecified: Secondary | ICD-10-CM | POA: Diagnosis not present

## 2020-09-19 ENCOUNTER — Telehealth: Payer: Self-pay | Admitting: *Deleted

## 2020-09-19 NOTE — Telephone Encounter (Signed)
TCT patient regarding recent lab results. No answer but was able to leave vm message for pt to return call at her earliest convenience to review her lab results @ [phone]

## 2020-09-19 NOTE — Telephone Encounter (Signed)
-----   Message from John T Dorsey IV, MD sent at 09/17/2020  8:40 AM EDT ----- Please let Jacqueline Coleman know that her bloodwork showed no evidence of a mutation causing her elevated WBC and Hgb. The reason she has elevated counts is most likely smoking vs possible obstructive sleep apnea. Please assure she has been contacted by the sleep study group. If this has not occurred we can place the referral again today.  ----- Message ----- From: Interface, Lab In [place] Sent: 08/15/2020  10:14 AM EDT To: John T Dorsey IV, MD   

## 2020-09-19 NOTE — Telephone Encounter (Signed)
-----   Message from Jaci Standard, MD sent at 09/17/2020  8:40 AM EDT ----- Please let Mrs. Helzer know that her bloodwork showed no evidence of a mutation causing her elevated WBC and Hgb. The reason she has elevated counts is most likely smoking vs possible obstructive sleep apnea. Please assure she has been contacted by the sleep study group. If this has not occurred we can place the referral again today.  ----- Message ----- From: Interface, Lab In [place] Sent: 08/15/2020  10:14 AM EDT To: Jaci Standard, MD

## 2020-09-20 ENCOUNTER — Telehealth: Payer: Self-pay | Admitting: Hematology and Oncology

## 2020-09-20 NOTE — Telephone Encounter (Signed)
Released records to dermatology specialists at [phone]    Release:  [id number]

## 2020-10-30 DIAGNOSIS — Z79899 Other long term (current) drug therapy: Secondary | ICD-10-CM | POA: Diagnosis not present

## 2020-10-30 DIAGNOSIS — M0579 Rheumatoid arthritis with rheumatoid factor of multiple sites without organ or systems involvement: Secondary | ICD-10-CM | POA: Diagnosis not present

## 2020-10-30 DIAGNOSIS — M2550 Pain in unspecified joint: Secondary | ICD-10-CM | POA: Diagnosis not present

## 2020-10-30 DIAGNOSIS — M25531 Pain in right wrist: Secondary | ICD-10-CM | POA: Diagnosis not present

## 2020-10-30 DIAGNOSIS — M79643 Pain in unspecified hand: Secondary | ICD-10-CM | POA: Diagnosis not present

## 2020-10-31 DIAGNOSIS — M0579 Rheumatoid arthritis with rheumatoid factor of multiple sites without organ or systems involvement: Secondary | ICD-10-CM | POA: Diagnosis not present

## 2020-11-11 DIAGNOSIS — Z1152 Encounter for screening for COVID-19: Secondary | ICD-10-CM | POA: Diagnosis not present

## 2020-11-11 DIAGNOSIS — J209 Acute bronchitis, unspecified: Secondary | ICD-10-CM | POA: Diagnosis not present

## 2020-11-11 DIAGNOSIS — R059 Cough, unspecified: Secondary | ICD-10-CM | POA: Diagnosis not present

## 2020-11-23 DIAGNOSIS — E1169 Type 2 diabetes mellitus with other specified complication: Secondary | ICD-10-CM | POA: Diagnosis not present

## 2020-11-28 DIAGNOSIS — M461 Sacroiliitis, not elsewhere classified: Secondary | ICD-10-CM | POA: Insufficient documentation

## 2020-11-28 DIAGNOSIS — E6609 Other obesity due to excess calories: Secondary | ICD-10-CM

## 2020-12-26 DIAGNOSIS — M0579 Rheumatoid arthritis with rheumatoid factor of multiple sites without organ or systems involvement: Secondary | ICD-10-CM | POA: Diagnosis not present

## 2020-12-27 DIAGNOSIS — R030 Elevated blood-pressure reading, without diagnosis of hypertension: Secondary | ICD-10-CM | POA: Insufficient documentation

## 2020-12-27 DIAGNOSIS — M461 Sacroiliitis, not elsewhere classified: Secondary | ICD-10-CM | POA: Diagnosis not present

## 2020-12-27 DIAGNOSIS — M5136 Other intervertebral disc degeneration, lumbar region: Secondary | ICD-10-CM | POA: Diagnosis not present

## 2020-12-27 DIAGNOSIS — M459 Ankylosing spondylitis of unspecified sites in spine: Secondary | ICD-10-CM | POA: Diagnosis not present

## 2020-12-27 DIAGNOSIS — Z6834 Body mass index (BMI) 34.0-34.9, adult: Secondary | ICD-10-CM | POA: Diagnosis not present

## 2021-01-22 DIAGNOSIS — M461 Sacroiliitis, not elsewhere classified: Secondary | ICD-10-CM | POA: Diagnosis not present

## 2021-01-30 DIAGNOSIS — D751 Secondary polycythemia: Secondary | ICD-10-CM | POA: Diagnosis not present

## 2021-01-30 DIAGNOSIS — K3184 Gastroparesis: Secondary | ICD-10-CM | POA: Diagnosis not present

## 2021-01-30 DIAGNOSIS — E1169 Type 2 diabetes mellitus with other specified complication: Secondary | ICD-10-CM | POA: Diagnosis not present

## 2021-01-30 DIAGNOSIS — Z9641 Presence of insulin pump (external) (internal): Secondary | ICD-10-CM | POA: Diagnosis not present

## 2021-02-11 ENCOUNTER — Telehealth: Payer: Self-pay | Admitting: Hematology and Oncology

## 2021-02-11 ENCOUNTER — Other Ambulatory Visit: Payer: Self-pay | Admitting: Hematology and Oncology

## 2021-02-11 DIAGNOSIS — D751 Secondary polycythemia: Secondary | ICD-10-CM

## 2021-02-11 NOTE — Telephone Encounter (Signed)
R/s 3/10 appt per patient request. Confirmed new appt date and time.

## 2021-02-14 ENCOUNTER — Inpatient Hospital Stay: Payer: BC Managed Care – PPO | Admitting: Hematology and Oncology

## 2021-02-14 ENCOUNTER — Inpatient Hospital Stay: Payer: BC Managed Care – PPO

## 2021-02-20 DIAGNOSIS — M0579 Rheumatoid arthritis with rheumatoid factor of multiple sites without organ or systems involvement: Secondary | ICD-10-CM | POA: Diagnosis not present

## 2021-02-22 DIAGNOSIS — Z794 Long term (current) use of insulin: Secondary | ICD-10-CM | POA: Diagnosis not present

## 2021-02-27 DIAGNOSIS — M25539 Pain in unspecified wrist: Secondary | ICD-10-CM | POA: Diagnosis not present

## 2021-02-27 DIAGNOSIS — M79643 Pain in unspecified hand: Secondary | ICD-10-CM | POA: Diagnosis not present

## 2021-02-27 DIAGNOSIS — M0579 Rheumatoid arthritis with rheumatoid factor of multiple sites without organ or systems involvement: Secondary | ICD-10-CM | POA: Diagnosis not present

## 2021-02-27 DIAGNOSIS — Z79899 Other long term (current) drug therapy: Secondary | ICD-10-CM | POA: Diagnosis not present

## 2021-02-27 DIAGNOSIS — M25531 Pain in right wrist: Secondary | ICD-10-CM | POA: Diagnosis not present

## 2021-02-28 ENCOUNTER — Other Ambulatory Visit: Payer: Self-pay | Admitting: Rheumatology

## 2021-02-28 DIAGNOSIS — M47819 Spondylosis without myelopathy or radiculopathy, site unspecified: Secondary | ICD-10-CM

## 2021-03-06 ENCOUNTER — Other Ambulatory Visit (HOSPITAL_COMMUNITY): Payer: Self-pay | Admitting: Physician Assistant

## 2021-03-06 ENCOUNTER — Other Ambulatory Visit: Payer: Self-pay | Admitting: Physician Assistant

## 2021-03-06 DIAGNOSIS — R1110 Vomiting, unspecified: Secondary | ICD-10-CM | POA: Diagnosis not present

## 2021-03-06 DIAGNOSIS — R1010 Upper abdominal pain, unspecified: Secondary | ICD-10-CM | POA: Diagnosis not present

## 2021-03-06 DIAGNOSIS — E119 Type 2 diabetes mellitus without complications: Secondary | ICD-10-CM | POA: Diagnosis not present

## 2021-03-14 ENCOUNTER — Ambulatory Visit
Admission: RE | Admit: 2021-03-14 | Discharge: 2021-03-14 | Disposition: A | Discharge status: Home / Self Care | Payer: BC Managed Care – PPO | Source: Ambulatory Visit | Attending: Physician Assistant | Admitting: Physician Assistant

## 2021-03-14 DIAGNOSIS — R1010 Upper abdominal pain, unspecified: Secondary | ICD-10-CM

## 2021-03-18 ENCOUNTER — Encounter (HOSPITAL_COMMUNITY): Payer: Self-pay

## 2021-03-18 ENCOUNTER — Observation Stay (HOSPITAL_COMMUNITY)
Admission: EM | Admit: 2021-03-18 | Discharge: 2021-03-20 | Disposition: A | Discharge status: Home / Self Care | Payer: BC Managed Care – PPO | Attending: Student | Admitting: Student

## 2021-03-18 ENCOUNTER — Other Ambulatory Visit: Payer: Self-pay

## 2021-03-18 DIAGNOSIS — R109 Unspecified abdominal pain: Secondary | ICD-10-CM | POA: Diagnosis not present

## 2021-03-18 DIAGNOSIS — F1210 Cannabis abuse, uncomplicated: Secondary | ICD-10-CM | POA: Diagnosis present

## 2021-03-18 DIAGNOSIS — Z794 Long term (current) use of insulin: Secondary | ICD-10-CM | POA: Diagnosis not present

## 2021-03-18 DIAGNOSIS — F17210 Nicotine dependence, cigarettes, uncomplicated: Secondary | ICD-10-CM | POA: Diagnosis not present

## 2021-03-18 DIAGNOSIS — Z20822 Contact with and (suspected) exposure to covid-19: Secondary | ICD-10-CM | POA: Insufficient documentation

## 2021-03-18 DIAGNOSIS — K8010 Calculus of gallbladder with chronic cholecystitis without obstruction: Secondary | ICD-10-CM | POA: Diagnosis not present

## 2021-03-18 DIAGNOSIS — K8050 Calculus of bile duct without cholangitis or cholecystitis without obstruction: Secondary | ICD-10-CM | POA: Diagnosis present

## 2021-03-18 DIAGNOSIS — E6609 Other obesity due to excess calories: Secondary | ICD-10-CM

## 2021-03-18 DIAGNOSIS — M459 Ankylosing spondylitis of unspecified sites in spine: Secondary | ICD-10-CM | POA: Diagnosis present

## 2021-03-18 DIAGNOSIS — M069 Rheumatoid arthritis, unspecified: Secondary | ICD-10-CM | POA: Diagnosis present

## 2021-03-18 DIAGNOSIS — E119 Type 2 diabetes mellitus without complications: Secondary | ICD-10-CM | POA: Insufficient documentation

## 2021-03-18 DIAGNOSIS — F17200 Nicotine dependence, unspecified, uncomplicated: Secondary | ICD-10-CM | POA: Diagnosis present

## 2021-03-18 DIAGNOSIS — K8020 Calculus of gallbladder without cholecystitis without obstruction: Secondary | ICD-10-CM | POA: Diagnosis present

## 2021-03-18 LAB — URINALYSIS, ROUTINE W REFLEX MICROSCOPIC
Bilirubin Urine: NEGATIVE
Glucose, UA: 150 mg/dL — AB
Hgb urine dipstick: NEGATIVE
Ketones, ur: 80 mg/dL — AB
Leukocytes,Ua: NEGATIVE
Nitrite: NEGATIVE
Protein, ur: 30 mg/dL — AB
Specific Gravity, Urine: 1.028 (ref 1.005–1.030)
pH: 5 (ref 5.0–8.0)

## 2021-03-18 LAB — CBC
HCT: 51.7 % — ABNORMAL HIGH (ref 36.0–46.0)
Hemoglobin: 17 g/dL — ABNORMAL HIGH (ref 12.0–15.0)
MCH: 29.4 pg (ref 26.0–34.0)
MCHC: 32.9 g/dL (ref 30.0–36.0)
MCV: 89.4 fL (ref 80.0–100.0)
Platelets: 278 10*3/uL (ref 150–400)
RBC: 5.78 MIL/uL — ABNORMAL HIGH (ref 3.87–5.11)
RDW: 13.2 % (ref 11.5–15.5)
WBC: 15.7 10*3/uL — ABNORMAL HIGH (ref 4.0–10.5)
nRBC: 0 % (ref 0.0–0.2)

## 2021-03-18 LAB — DIFFERENTIAL
Abs Immature Granulocytes: 0.12 10*3/uL — ABNORMAL HIGH (ref 0.00–0.07)
Basophils Absolute: 0.1 10*3/uL (ref 0.0–0.1)
Basophils Relative: 0 %
Eosinophils Absolute: 0 10*3/uL (ref 0.0–0.5)
Eosinophils Relative: 0 %
Immature Granulocytes: 1 %
Lymphocytes Relative: 12 %
Lymphs Abs: 1.9 10*3/uL (ref 0.7–4.0)
Monocytes Absolute: 1 10*3/uL (ref 0.1–1.0)
Monocytes Relative: 7 %
Neutro Abs: 12.6 10*3/uL — ABNORMAL HIGH (ref 1.7–7.7)
Neutrophils Relative %: 80 %

## 2021-03-18 LAB — COMPREHENSIVE METABOLIC PANEL
ALT: 27 U/L (ref 0–44)
AST: 20 U/L (ref 15–41)
Albumin: 4.3 g/dL (ref 3.5–5.0)
Alkaline Phosphatase: 69 U/L (ref 38–126)
Anion gap: 12 (ref 5–15)
BUN: 10 mg/dL (ref 6–20)
CO2: 22 mmol/L (ref 22–32)
Calcium: 9.5 mg/dL (ref 8.9–10.3)
Chloride: 105 mmol/L (ref 98–111)
Creatinine, Ser: 0.7 mg/dL (ref 0.44–1.00)
GFR, Estimated: >60 mL/min (ref >60)
Glucose, Bld: 187 mg/dL — ABNORMAL HIGH (ref 70–99)
Potassium: 3.8 mmol/L (ref 3.5–5.1)
Sodium: 139 mmol/L (ref 135–145)
Total Bilirubin: 0.9 mg/dL (ref 0.3–1.2)
Total Protein: 7.4 g/dL (ref 6.5–8.1)

## 2021-03-18 LAB — LIPASE, BLOOD: Lipase: 25 U/L (ref 11–51)

## 2021-03-18 LAB — CBG MONITORING, ED: Glucose-Capillary: 208 mg/dL — ABNORMAL HIGH (ref 70–99)

## 2021-03-18 MED ORDER — SODIUM CHLORIDE 0.9 % IV BOLUS
1000.0000 mL | Freq: Once | INTRAVENOUS | Status: AC
  Administered 2021-03-18: 1000 mL via INTRAVENOUS

## 2021-03-18 MED ORDER — ONDANSETRON HCL 4 MG/2ML IJ SOLN
4.0000 mg | Freq: Once | INTRAMUSCULAR | Status: AC
  Administered 2021-03-18: 4 mg via INTRAVENOUS
  Filled 2021-03-18: qty 2

## 2021-03-18 MED ORDER — SODIUM CHLORIDE 0.9 % IV SOLN: INTRAVENOUS | Status: DC

## 2021-03-18 MED ORDER — HYDROMORPHONE HCL 1 MG/ML IJ SOLN
1.0000 mg | Freq: Once | INTRAMUSCULAR | Status: AC
  Administered 2021-03-18: 1 mg via INTRAVENOUS
  Filled 2021-03-18: qty 1

## 2021-03-18 NOTE — ED Notes (Signed)
Pt. CBG 208, RN, Riki Rusk made aware. Pt. Has blood sugar pump.

## 2021-03-18 NOTE — ED Provider Notes (Addendum)
[place] DEPT Provider Note   CSN: [id number] Arrival date & time: 03/18/21  1854     History Chief Complaint  Patient presents with  . Abdominal Pain  . Emesis    Jacqueline Coleman is a 60 y.o. female.  Patient had ultrasound done on April 7 which showed several large stones.  Common bile duct measurement was 5.6 at that time.  Patient for many months now has had periods of abdominal pain associated with nausea vomiting.  Her doctors in the past thought maybe it was related to gastroparesis because she does have diabetes and is on an insulin pump.  This morning at 8:00 started with abdominal pain right upper quadrant several episodes of vomiting.  Upon arrival here patient tender in the right upper quadrant area.  No blood in the vomit.  No fevers.        Past Medical History:  Diagnosis Date  . Ankylosing spondylitis ([place])   . Diabetes mellitus without complication ([place])   . Osteoporosis   . Rheumatoid arthritis [facility])     Patient Active Problem List   Diagnosis Date Noted  . DKA (diabetic ketoacidoses) 08/20/2019  . Tobacco dependence 08/20/2019  . Marijuana abuse 08/20/2019  . Rheumatoid arthritis ([place])   . Ankylosing spondylitis ([place])     Past Surgical History:  Procedure Laterality Date  . CARPAL TUNNEL RELEASE Right 2003  . CESAREAN SECTION       OB History   No obstetric history on file.     Family History  Problem Relation Age of Onset  . Diabetes Mother   . Diabetes Father   . Breast cancer Neg Hx     Social History   Tobacco Use  . Smoking status: Current Every Day Smoker    Packs/day: 0.15    Years: 30.00    Pack years: 4.50    Types: Cigarettes  . Smokeless tobacco: Never Used  Vaping Use  . Vaping Use: Never used  Substance Use Topics  . Alcohol use: Not Currently  . Drug use: Yes    Types: Marijuana    Home Medications Prior to Admission medications   Medication Sig Start Date End Date  Taking? Authorizing Provider  Continuous Blood Gluc Sensor (DEXCOM G6 SENSOR) MISC SMARTSIG:1 Each Topical Every 10 Days 08/12/20   [provider]  Continuous Blood Gluc Transmit (DEXCOM G6 TRANSMITTER) MISC  05/28/20   [provider]  estradiol (ESTRACE) 2 MG tablet Take 2 mg by mouth every other day. 02/28/19   [provider]  finasteride (PROSCAR) 5 MG tablet Take 5 mg by mouth every other day. 08/06/19   [provider]  gabapentin (NEURONTIN) 100 MG capsule Take 100 mg by mouth daily. 04/23/20   [provider]  HUMALOG 100 UNIT/ML injection Inject into the skin. 07/13/20   [provider]  HUMIRA PEN 40 MG/0.4ML PNKT SMARTSIG:40 Milligram(s) SUB-Q Every 2 Weeks 03/09/20   [provider]  ketoconazole (NIZORAL) 2 % shampoo Apply 1 application topically daily. 08/11/19   [provider]  leflunomide (ARAVA) 10 MG tablet Take 10 mg by mouth daily. 07/08/20   [provider]  progesterone (PROMETRIUM) 100 MG capsule Take 100 mg by mouth every other day. 02/28/19   [provider]  progesterone (PROMETRIUM) 100 MG capsule Take 100 mg by mouth at bedtime. 07/08/20   [provider]  spironolactone (ALDACTONE) 100 MG tablet Take 100 mg by mouth 2 (two) times daily.  08/06/19   [provider]    Allergies    Methotrexate, Adhesive [tape], Codeine, Farxiga [dapagliflozin], Lisinopril, Losartan, and Prednisone  Review of Systems   Review of Systems  Constitutional: Negative for chills and fever.  HENT: Negative for ear pain, rhinorrhea and sore throat.   Eyes: Negative for pain and visual disturbance.  Respiratory: Negative for cough and shortness of breath.   Cardiovascular: Negative for chest pain, palpitations and leg swelling.  Gastrointestinal: Positive for abdominal pain, nausea and vomiting. Negative for diarrhea.  Genitourinary: Negative for dysuria and hematuria.  Musculoskeletal: Negative  for arthralgias, back pain and neck pain.  Skin: Negative for color change and rash.  Neurological: Negative for dizziness, seizures, syncope, light-headedness and headaches.  Hematological: Does not bruise/bleed easily.  Psychiatric/Behavioral: Negative for confusion.  All other systems reviewed and are negative.   Physical Exam Updated Vital Signs BP 139/61   Pulse 88   Temp 98.1 F (36.7 C) (Oral)   Resp 18   Ht 1.6 m (_0 )   Wt 90.3 kg   SpO2 94%   BMI 35.25 kg/m   Physical Exam Vitals and nursing note reviewed.  Constitutional:      General: She is in acute distress.     Appearance: She is well-developed.  HENT:     Head: Normocephalic and atraumatic.     Mouth/Throat:     Mouth: Mucous membranes are dry.  Eyes:     Conjunctiva/sclera: Conjunctivae normal.  Cardiovascular:     Rate and Rhythm: Normal rate and regular rhythm.     Heart sounds: No murmur heard.   Pulmonary:     Effort: Pulmonary effort is normal. No respiratory distress.     Breath sounds: Normal breath sounds.  Abdominal:     Palpations: Abdomen is soft.     Tenderness: There is abdominal tenderness. There is guarding.  Musculoskeletal:     Cervical back: Normal range of motion and neck supple.  Skin:    General: Skin is warm and dry.  Neurological:     General: No focal deficit present.     Mental Status: She is alert and oriented to person, place, and time.     Cranial Nerves: No cranial nerve deficit.     Sensory: No sensory deficit.     Motor: No weakness.     ED Results / Procedures / Treatments   Labs (all labs ordered are listed, but only abnormal results are displayed) Labs Reviewed  COMPREHENSIVE METABOLIC PANEL - Abnormal; Notable for the following components:      Result Value   Glucose, Bld 187 (*)    All other components within normal limits  CBC - Abnormal; Notable for the following components:   WBC 15.7 (*)    RBC 5.78 (*)    Hemoglobin 17.0 (*)    HCT 51.7 (*)     All other components within normal limits  URINALYSIS, ROUTINE W REFLEX MICROSCOPIC - Abnormal; Notable for the following components:   Color, Urine AMBER (*)    APPearance TURBID (*)    Glucose, UA 150 (*)    Ketones, ur 80 (*)    Protein, ur 30 (*)    Bacteria, UA RARE (*)    All other components within normal limits  DIFFERENTIAL - Abnormal; Notable for the following components:   Neutro Abs 12.6 (*)    Abs Immature Granulocytes 0.12 (*)    All other components within normal limits  CBG MONITORING, ED -  Abnormal; Notable for the following components:   Glucose-Capillary 208 (*)    All other components within normal limits  RESP PANEL BY RT-PCR (FLU A&B, COVID) ARPGX2  LIPASE, BLOOD    EKG None  Radiology No results found.  Procedures Procedures   Medications Ordered in ED Medications  0.9 %  sodium chloride infusion ( Intravenous New Bag/Given 03/18/21 2130)  sodium chloride 0.9 % bolus 1,000 mL (1,000 mLs Intravenous New Bag/Given 03/18/21 2130)  HYDROmorphone (DILAUDID) injection 1 mg (1 mg Intravenous Given 03/18/21 2132)  ondansetron (ZOFRAN) injection 4 mg (4 mg Intravenous Given 03/18/21 2130)    ED Course  I have reviewed the triage vital signs and the nursing notes.  Pertinent labs & imaging results that were available during my care of the patient were reviewed by me and considered in my medical decision making (see chart for details).    MDM Rules/Calculators/A&P                          Patient given antinausea medicine and pain medication.  Vomiting has improved.  But she is still tender in the right upper quadrant.  She now had pain since 8:00 this morning.  White blood cell count 15.7.  Liver function test without any acute abnormalities.  Alk phos is normal.  Bilirubin is normal.  Patient did feel better after the pain medication but still is tender.  I will discuss with general surgery for prolonged biliary colic may be some acute cholecystitis.   Patient did have ultrasound on April 7 that showed multiple large gallstones with a normal common bile duct size.  Discussed with Dr. Brantley Stage from general surgery.  He recommends hospitalist admission due to her diabetes and they will consult for the prolonged biliary colic may be early acute cholecystitis.  He did not recommend further imaging at this time.  Final Clinical Impression(s) / ED Diagnoses Final diagnoses:  Biliary colic    Rx / DC Orders ED Discharge Orders    None       Fredia Sorrow, MD 03/18/21 2324    Fredia Sorrow, MD 03/18/21 3754    Fredia Sorrow, MD 03/18/21 2354

## 2021-03-18 NOTE — ED Notes (Signed)
Pt has Dexcom monitor and insulin pump currently operating.

## 2021-03-18 NOTE — ED Triage Notes (Signed)
Patient c/o RUQ pain and vomiting x 1 year. Patient states she was told today that she had a large gallstone.

## 2021-03-19 ENCOUNTER — Observation Stay (HOSPITAL_COMMUNITY): Payer: BC Managed Care – PPO | Admitting: Anesthesiology

## 2021-03-19 ENCOUNTER — Encounter (HOSPITAL_COMMUNITY): Payer: Self-pay | Admitting: Student

## 2021-03-19 ENCOUNTER — Encounter (HOSPITAL_COMMUNITY)
Admission: EM | Disposition: A | Discharge status: Home / Self Care | Payer: Self-pay | Source: Home / Self Care | Attending: Student

## 2021-03-19 DIAGNOSIS — K8020 Calculus of gallbladder without cholecystitis without obstruction: Secondary | ICD-10-CM | POA: Diagnosis not present

## 2021-03-19 DIAGNOSIS — E785 Hyperlipidemia, unspecified: Secondary | ICD-10-CM | POA: Insufficient documentation

## 2021-03-19 DIAGNOSIS — M069 Rheumatoid arthritis, unspecified: Secondary | ICD-10-CM

## 2021-03-19 DIAGNOSIS — R1011 Right upper quadrant pain: Secondary | ICD-10-CM

## 2021-03-19 DIAGNOSIS — I890 Lymphedema, not elsewhere classified: Secondary | ICD-10-CM | POA: Insufficient documentation

## 2021-03-19 DIAGNOSIS — K8050 Calculus of bile duct without cholangitis or cholecystitis without obstruction: Secondary | ICD-10-CM | POA: Diagnosis present

## 2021-03-19 DIAGNOSIS — Z720 Tobacco use: Secondary | ICD-10-CM | POA: Insufficient documentation

## 2021-03-19 DIAGNOSIS — M459 Ankylosing spondylitis of unspecified sites in spine: Secondary | ICD-10-CM

## 2021-03-19 DIAGNOSIS — E1110 Type 2 diabetes mellitus with ketoacidosis without coma: Secondary | ICD-10-CM | POA: Diagnosis not present

## 2021-03-19 DIAGNOSIS — R109 Unspecified abdominal pain: Secondary | ICD-10-CM | POA: Diagnosis present

## 2021-03-19 DIAGNOSIS — K3184 Gastroparesis: Secondary | ICD-10-CM | POA: Insufficient documentation

## 2021-03-19 DIAGNOSIS — E538 Deficiency of other specified B group vitamins: Secondary | ICD-10-CM | POA: Insufficient documentation

## 2021-03-19 DIAGNOSIS — K8010 Calculus of gallbladder with chronic cholecystitis without obstruction: Secondary | ICD-10-CM | POA: Diagnosis not present

## 2021-03-19 DIAGNOSIS — Z9641 Presence of insulin pump (external) (internal): Secondary | ICD-10-CM | POA: Insufficient documentation

## 2021-03-19 HISTORY — PX: CHOLECYSTECTOMY: SHX55

## 2021-03-19 LAB — COMPREHENSIVE METABOLIC PANEL
ALT: 21 U/L (ref 0–44)
AST: 14 U/L — ABNORMAL LOW (ref 15–41)
Albumin: 3.4 g/dL — ABNORMAL LOW (ref 3.5–5.0)
Alkaline Phosphatase: 57 U/L (ref 38–126)
Anion gap: 7 (ref 5–15)
BUN: 9 mg/dL (ref 6–20)
CO2: 22 mmol/L (ref 22–32)
Calcium: 8.6 mg/dL — ABNORMAL LOW (ref 8.9–10.3)
Chloride: 108 mmol/L (ref 98–111)
Creatinine, Ser: 0.52 mg/dL (ref 0.44–1.00)
GFR, Estimated: >60 mL/min (ref >60)
Glucose, Bld: 116 mg/dL — ABNORMAL HIGH (ref 70–99)
Potassium: 3.7 mmol/L (ref 3.5–5.1)
Sodium: 137 mmol/L (ref 135–145)
Total Bilirubin: 0.9 mg/dL (ref 0.3–1.2)
Total Protein: 6 g/dL — ABNORMAL LOW (ref 6.5–8.1)

## 2021-03-19 LAB — MRSA PCR SCREENING: MRSA by PCR: NEGATIVE

## 2021-03-19 LAB — CBC
HCT: 44 % (ref 36.0–46.0)
Hemoglobin: 14.3 g/dL (ref 12.0–15.0)
MCH: 29.6 pg (ref 26.0–34.0)
MCHC: 32.5 g/dL (ref 30.0–36.0)
MCV: 91.1 fL (ref 80.0–100.0)
Platelets: 222 10*3/uL (ref 150–400)
RBC: 4.83 MIL/uL (ref 3.87–5.11)
RDW: 13.2 % (ref 11.5–15.5)
WBC: 13.8 10*3/uL — ABNORMAL HIGH (ref 4.0–10.5)
nRBC: 0 % (ref 0.0–0.2)

## 2021-03-19 LAB — GLUCOSE, CAPILLARY
Glucose-Capillary: 159 mg/dL — ABNORMAL HIGH (ref 70–99)
Glucose-Capillary: 115 mg/dL — ABNORMAL HIGH (ref 70–99)
Glucose-Capillary: 134 mg/dL — ABNORMAL HIGH (ref 70–99)
Glucose-Capillary: 104 mg/dL — ABNORMAL HIGH (ref 70–99)
Glucose-Capillary: 155 mg/dL — ABNORMAL HIGH (ref 70–99)
Glucose-Capillary: 198 mg/dL — ABNORMAL HIGH (ref 70–99)

## 2021-03-19 LAB — HEMOGLOBIN A1C
Hgb A1c MFr Bld: 7.4 % — ABNORMAL HIGH (ref 4.8–5.6)
Mean Plasma Glucose: 165.68 mg/dL

## 2021-03-19 LAB — RESP PANEL BY RT-PCR (FLU A&B, COVID) ARPGX2
Influenza A by PCR: NEGATIVE
Influenza B by PCR: NEGATIVE
SARS Coronavirus 2 by RT PCR: NEGATIVE

## 2021-03-19 SURGERY — LAPAROSCOPIC CHOLECYSTECTOMY
Anesthesia: General

## 2021-03-19 MED ORDER — LACTATED RINGERS IV SOLN: INTRAVENOUS | Status: DC

## 2021-03-19 MED ORDER — ROCURONIUM BROMIDE 10 MG/ML (PF) SYRINGE
PREFILLED_SYRINGE | INTRAVENOUS | Status: AC
  Filled 2021-03-19: qty 10

## 2021-03-19 MED ORDER — ALUM & MAG HYDROXIDE-SIMETH 200-200-20 MG/5ML PO SUSP
30.0000 mL | ORAL | Status: DC | PRN
  Administered 2021-03-19: 30 mL via ORAL
  Filled 2021-03-19: qty 30

## 2021-03-19 MED ORDER — ONDANSETRON HCL 4 MG/2ML IJ SOLN: 4.0000 mg | Freq: Four times a day (QID) | INTRAMUSCULAR | Status: DC | PRN

## 2021-03-19 MED ORDER — ONDANSETRON HCL 4 MG/2ML IJ SOLN: 4.0000 mg | Freq: Once | INTRAMUSCULAR | Status: DC | PRN

## 2021-03-19 MED ORDER — ONDANSETRON HCL 4 MG/2ML IJ SOLN
INTRAMUSCULAR | Status: AC
  Filled 2021-03-19: qty 2

## 2021-03-19 MED ORDER — OXYCODONE HCL 5 MG PO TABS: 5.0000 mg | ORAL_TABLET | Freq: Once | ORAL | Status: DC | PRN

## 2021-03-19 MED ORDER — CEFAZOLIN SODIUM-DEXTROSE 2-4 GM/100ML-% IV SOLN
INTRAVENOUS | Status: AC
  Filled 2021-03-19: qty 100

## 2021-03-19 MED ORDER — PROPOFOL 10 MG/ML IV BOLUS
INTRAVENOUS | Status: DC | PRN
  Administered 2021-03-19: 170 mg via INTRAVENOUS
  Administered 2021-03-19: 30 mg via INTRAVENOUS

## 2021-03-19 MED ORDER — ROCURONIUM BROMIDE 10 MG/ML (PF) SYRINGE
PREFILLED_SYRINGE | INTRAVENOUS | Status: DC | PRN
  Administered 2021-03-19: 50 mg via INTRAVENOUS

## 2021-03-19 MED ORDER — 0.9 % SODIUM CHLORIDE (POUR BTL) OPTIME
TOPICAL | Status: DC | PRN
  Administered 2021-03-19: 1000 mL

## 2021-03-19 MED ORDER — CHLORHEXIDINE GLUCONATE 0.12 % MT SOLN
15.0000 mL | OROMUCOSAL | Status: AC
  Administered 2021-03-19: 15 mL via OROMUCOSAL

## 2021-03-19 MED ORDER — BUPIVACAINE-EPINEPHRINE (PF) 0.25% -1:200000 IJ SOLN
INTRAMUSCULAR | Status: DC | PRN
  Administered 2021-03-19: 16 mL

## 2021-03-19 MED ORDER — FENTANYL CITRATE (PF) 100 MCG/2ML IJ SOLN: 25.0000 ug | INTRAMUSCULAR | Status: DC | PRN

## 2021-03-19 MED ORDER — CEFAZOLIN SODIUM-DEXTROSE 2-4 GM/100ML-% IV SOLN
2.0000 g | Freq: Once | INTRAVENOUS | Status: AC
  Administered 2021-03-19: 2 g via INTRAVENOUS

## 2021-03-19 MED ORDER — LABETALOL HCL 5 MG/ML IV SOLN
INTRAVENOUS | Status: DC | PRN
  Administered 2021-03-19: 5 mg via INTRAVENOUS

## 2021-03-19 MED ORDER — ONDANSETRON 4 MG PO TBDP: 4.0000 mg | ORAL_TABLET | Freq: Four times a day (QID) | ORAL | Status: DC | PRN

## 2021-03-19 MED ORDER — LIDOCAINE 2% (20 MG/ML) 5 ML SYRINGE
INTRAMUSCULAR | Status: AC
  Filled 2021-03-19: qty 5

## 2021-03-19 MED ORDER — FENTANYL CITRATE (PF) 250 MCG/5ML IJ SOLN
INTRAMUSCULAR | Status: AC
  Filled 2021-03-19: qty 5

## 2021-03-19 MED ORDER — BUPIVACAINE-EPINEPHRINE (PF) 0.25% -1:200000 IJ SOLN
INTRAMUSCULAR | Status: AC
  Filled 2021-03-19: qty 30

## 2021-03-19 MED ORDER — OXYCODONE HCL 5 MG/5ML PO SOLN: 5.0000 mg | Freq: Once | ORAL | Status: DC | PRN

## 2021-03-19 MED ORDER — HYDROMORPHONE HCL 1 MG/ML IJ SOLN
INTRAMUSCULAR | Status: DC | PRN
  Administered 2021-03-19: 2 mg via INTRAVENOUS

## 2021-03-19 MED ORDER — ACETAMINOPHEN 325 MG PO TABS: 650.0000 mg | ORAL_TABLET | Freq: Four times a day (QID) | ORAL | Status: DC | PRN

## 2021-03-19 MED ORDER — HYDROMORPHONE HCL 2 MG/ML IJ SOLN
INTRAMUSCULAR | Status: AC
  Filled 2021-03-19: qty 1

## 2021-03-19 MED ORDER — LABETALOL HCL 5 MG/ML IV SOLN
INTRAVENOUS | Status: AC
  Filled 2021-03-19: qty 8

## 2021-03-19 MED ORDER — INSULIN ASPART 100 UNIT/ML ~~LOC~~ SOLN
0.0000 [IU] | Freq: Three times a day (TID) | SUBCUTANEOUS | Status: DC
  Administered 2021-03-19: 2 [IU] via SUBCUTANEOUS
  Administered 2021-03-19: 4 [IU] via SUBCUTANEOUS
  Administered 2021-03-19: 3 [IU] via SUBCUTANEOUS
  Administered 2021-03-20: 2 [IU] via SUBCUTANEOUS
  Filled 2021-03-19: qty 0.15

## 2021-03-19 MED ORDER — ONDANSETRON HCL 4 MG/2ML IJ SOLN
INTRAMUSCULAR | Status: DC | PRN
  Administered 2021-03-19: 4 mg via INTRAVENOUS

## 2021-03-19 MED ORDER — DEXTROSE-NACL 5-0.9 % IV SOLN: INTRAVENOUS | Status: DC

## 2021-03-19 MED ORDER — PROPOFOL 10 MG/ML IV BOLUS
INTRAVENOUS | Status: AC
  Filled 2021-03-19: qty 20

## 2021-03-19 MED ORDER — HYDROMORPHONE HCL 1 MG/ML IJ SOLN
1.0000 mg | INTRAMUSCULAR | Status: DC | PRN
Start: 2021-03-19 — End: 2021-03-20
  Administered 2021-03-19: 1 mg via INTRAVENOUS
  Filled 2021-03-19: qty 1

## 2021-03-19 MED ORDER — MEPERIDINE HCL 50 MG/ML IJ SOLN: 6.2500 mg | INTRAMUSCULAR | Status: DC | PRN

## 2021-03-19 MED ORDER — LEFLUNOMIDE 20 MG PO TABS
20.0000 mg | ORAL_TABLET | Freq: Every day | ORAL | Status: DC
  Administered 2021-03-19: 20 mg via ORAL
  Filled 2021-03-19: qty 1

## 2021-03-19 MED ORDER — ACETAMINOPHEN 325 MG PO TABS: 325.0000 mg | ORAL_TABLET | ORAL | Status: DC | PRN

## 2021-03-19 MED ORDER — ACETAMINOPHEN 160 MG/5ML PO SOLN: 325.0000 mg | ORAL | Status: DC | PRN

## 2021-03-19 MED ORDER — MIDAZOLAM HCL 2 MG/2ML IJ SOLN
INTRAMUSCULAR | Status: AC
  Filled 2021-03-19: qty 2

## 2021-03-19 MED ORDER — HYDROMORPHONE HCL 1 MG/ML IJ SOLN: 0.5000 mg | INTRAMUSCULAR | Status: DC | PRN

## 2021-03-19 MED ORDER — LACTATED RINGERS IV SOLN
INTRAVENOUS | Status: DC | PRN
  Administered 2021-03-19: 1000 mL

## 2021-03-19 MED ORDER — KETOROLAC TROMETHAMINE 30 MG/ML IJ SOLN
30.0000 mg | Freq: Four times a day (QID) | INTRAMUSCULAR | Status: DC | PRN
  Administered 2021-03-19: 30 mg via INTRAVENOUS
  Filled 2021-03-19: qty 1

## 2021-03-19 MED ORDER — LIDOCAINE HCL (CARDIAC) PF 100 MG/5ML IV SOSY
PREFILLED_SYRINGE | INTRAVENOUS | Status: DC | PRN
  Administered 2021-03-19: 100 mg via INTRAVENOUS

## 2021-03-19 MED ORDER — SUGAMMADEX SODIUM 200 MG/2ML IV SOLN
INTRAVENOUS | Status: DC | PRN
  Administered 2021-03-19: 200 mg via INTRAVENOUS

## 2021-03-19 MED ORDER — FENTANYL CITRATE (PF) 100 MCG/2ML IJ SOLN
INTRAMUSCULAR | Status: AC
  Administered 2021-03-19: 25 ug via INTRAVENOUS
  Filled 2021-03-19: qty 2

## 2021-03-19 MED ORDER — FENTANYL CITRATE (PF) 250 MCG/5ML IJ SOLN
INTRAMUSCULAR | Status: DC | PRN
  Administered 2021-03-19: 100 ug via INTRAVENOUS
  Administered 2021-03-19: 50 ug via INTRAVENOUS
  Administered 2021-03-19: 100 ug via INTRAVENOUS

## 2021-03-19 MED ORDER — DEXMEDETOMIDINE (PRECEDEX) IN NS 20 MCG/5ML (4 MCG/ML) IV SYRINGE
PREFILLED_SYRINGE | INTRAVENOUS | Status: DC | PRN
  Administered 2021-03-19: 20 ug via INTRAVENOUS

## 2021-03-19 MED ORDER — MIDAZOLAM HCL 2 MG/2ML IJ SOLN
INTRAMUSCULAR | Status: DC | PRN
  Administered 2021-03-19: 2 mg via INTRAVENOUS

## 2021-03-19 SURGICAL SUPPLY — 40 items
ADH SKN CLS APL DERMABOND .7 (GAUZE/BANDAGES/DRESSINGS) ×1
APL PRP STRL LF DISP 70% ISPRP (MISCELLANEOUS) ×1
APPLIER CLIP 5 13 M/L LIGAMAX5 (MISCELLANEOUS)
APR CLP MED LRG 5 ANG JAW (MISCELLANEOUS)
BAG SPEC RTRVL 10 TROC 200 (ENDOMECHANICALS)
CABLE HIGH FREQUENCY MONO STRZ (ELECTRODE) ×2 IMPLANT
CHLORAPREP W/TINT 26 (MISCELLANEOUS) ×2 IMPLANT
CLIP APPLIE 5 13 M/L LIGAMAX5 (MISCELLANEOUS) IMPLANT
CLIP VESOLOCK MED LG 6/CT (CLIP) ×3 IMPLANT
COVER MAYO STAND STRL (DRAPES) ×2 IMPLANT
COVER TRANSDUCER ULTRASND (DRAPES) IMPLANT
COVER WAND RF STERILE (DRAPES) IMPLANT
DECANTER SPIKE VIAL GLASS SM (MISCELLANEOUS) ×2 IMPLANT
DERMABOND ADVANCED (GAUZE/BANDAGES/DRESSINGS) ×1
DERMABOND ADVANCED .7 DNX12 (GAUZE/BANDAGES/DRESSINGS) ×1 IMPLANT
DRAPE C-ARM 42X120 X-RAY (DRAPES) ×2 IMPLANT
ELECT REM PT RETURN 15FT ADLT (MISCELLANEOUS) ×2 IMPLANT
GAUZE SPONGE 2X2 8PLY STRL LF (GAUZE/BANDAGES/DRESSINGS) ×1 IMPLANT
GLOVE SURG ENC MOIS LTX SZ7.5 (GLOVE) ×2 IMPLANT
GOWN STRL REUS W/TWL XL LVL3 (GOWN DISPOSABLE) ×6 IMPLANT
GRASPER SUT TROCAR 14GX15 (MISCELLANEOUS) IMPLANT
KIT BASIN OR (CUSTOM PROCEDURE TRAY) ×2 IMPLANT
KIT TURNOVER KIT A (KITS) ×2 IMPLANT
NDL INSUFFLATION 14GA 120MM (NEEDLE) ×1 IMPLANT
NEEDLE INSUFFLATION 14GA 120MM (NEEDLE) ×2 IMPLANT
PENCIL SMOKE EVACUATOR (MISCELLANEOUS) IMPLANT
POUCH RETRIEVAL ECOSAC 10 (ENDOMECHANICALS) IMPLANT
POUCH RETRIEVAL ECOSAC 10MM (ENDOMECHANICALS)
SCISSORS LAP 5X35 DISP (ENDOMECHANICALS) ×2 IMPLANT
SET CHOLANGIOGRAPH MIX (MISCELLANEOUS) ×2 IMPLANT
SET IRRIG TUBING LAPAROSCOPIC (IRRIGATION / IRRIGATOR) ×2 IMPLANT
SET TUBE SMOKE EVAC HIGH FLOW (TUBING) ×2 IMPLANT
SLEEVE XCEL OPT CAN 5 100 (ENDOMECHANICALS) ×2 IMPLANT
SPONGE GAUZE 2X2 STER 10/PKG (GAUZE/BANDAGES/DRESSINGS) ×1
SUT MNCRL AB 4-0 PS2 18 (SUTURE) ×3 IMPLANT
SUT VICRYL 0 UR6 27IN ABS (SUTURE) ×1 IMPLANT
TOWEL OR 17X26 10 PK STRL BLUE (TOWEL DISPOSABLE) ×2 IMPLANT
TRAY LAPAROSCOPIC (CUSTOM PROCEDURE TRAY) ×2 IMPLANT
TROCAR BLADELESS OPT 5 100 (ENDOMECHANICALS) ×2 IMPLANT
TROCAR XCEL NON-BLD 11X100MML (ENDOMECHANICALS) ×2 IMPLANT

## 2021-03-19 NOTE — Transfer of Care (Signed)
Immediate Anesthesia Transfer of Care Note  Patient: [name]  Procedure(s) Performed: LAPAROSCOPIC CHOLECYSTECTOMY (N/A )  Patient Location: PACU  Anesthesia Type:General  Level of Consciousness: awake, drowsy and patient cooperative  Airway & Oxygen Therapy: Patient Spontanous Breathing and Patient connected to face mask oxygen  Post-op Assessment: Report given to RN and Post -op Vital signs reviewed and stable  Post vital signs: Reviewed and stable  Last Vitals:  Vitals Value Taken Time  BP 136/84 1719  Temp    Pulse 60   Resp    SpO2 100%     Last Pain:  Vitals:   03/19/21 1343  TempSrc: Oral  PainSc:       Patients Stated Pain Goal: 0 (03/19/21 0051)  Complications: No complications documented.

## 2021-03-19 NOTE — Consult Note (Signed)
[facility] Surgery Consult Note  Jacqueline Coleman August 10, 1961  [id number].    Requesting MD: Alanda Slim, MD Chief Complaint/Reason for Consult: cholelithiasis, nausea, vomiting  HPI:  Ms. Jacqueline Coleman is a 60 y/o F with a PMH DM2, ankylosing spondylitis, and RA on immunosuppressive therapy who presented to [facility] with a cc recurrent nausea, vomiting, epigastric pain. Patient reports a history of intermittent epigastric pain, nausea, and vomiting dating back to 2020 that started after eating tomato sauce/pasta from [place]. She modified her diet to limit acidic foods which helped initially. She now reports acute onset nausea, vomiting, and epigastric pain that comes on even before she eats in the morning. Her last episode was yesterday AM. She called her GI doctor who prescribed phenergan. When antiemetics did not help she came to the ED. Of note, she has been seeing her GI doctor for this issue. RUQ U/S 03/14/21 showed large gallstones without cholecystitis. She was referred to [facility] and has an appointment tomorrow 4/13. She currently smokes 4 cigarettes to half a ppd. She also intermittently smokes marijuana. She get Simponi infusions every 8 weeks, last infusion was 02/20/21. She denies allergies to antibiotic therapy. Reports allergies to the brown bandaids -- states she has to use waterproof bandages. She is unsure if its related to the adhesive or the nylon in the bandage. Denies latex allergy. Past abd surgeries include c-section x3.   ROS: Review of Systems  Constitutional: Positive for malaise/fatigue.  HENT: Negative.   Eyes: Negative.   Respiratory: Negative.   Cardiovascular: Negative.   Gastrointestinal: Positive for abdominal pain, nausea and vomiting. Negative for blood in stool and melena.  Genitourinary: Negative.   Musculoskeletal: Negative.   Skin: Negative.   Neurological: Negative.   Endo/Heme/Allergies: Negative.   Psychiatric/Behavioral: Negative.     Family  History  Problem Relation Age of Onset  . Diabetes Mother   . Diabetes Father   . Breast cancer Neg Hx     Past Medical History:  Diagnosis Date  . Ankylosing spondylitis (HCC)   . Diabetes mellitus without complication (HCC)   . Osteoporosis   . Rheumatoid arthritis [facility])     Past Surgical History:  Procedure Laterality Date  . CARPAL TUNNEL RELEASE Right 2003  . CESAREAN SECTION      Social History:  reports that she has been smoking cigarettes. She has a 4.50 pack-year smoking history. She has never used smokeless tobacco. She reports previous alcohol use. She reports current drug use. Drug: Marijuana.  Allergies:  Allergies  Allergen Reactions  . Methotrexate Other (See Comments)    Put pt in DKA  . Adhesive [Tape] Other (See Comments)    Blister    . Codeine Itching  . Farxiga [Dapagliflozin]   . Lisinopril Other (See Comments)    Back pain  . Losartan Other (See Comments)    Back pain   . Prednisone Other (See Comments)    Adverse reaction     Medications Prior to Admission  Medication Sig Dispense Refill  . cetirizine (ZYRTEC ALLERGY) 10 MG tablet Take 10 mg by mouth daily.    . Continuous Blood Gluc Sensor (DEXCOM G6 SENSOR) MISC SMARTSIG:1 Each Topical Every 10 Days    . Continuous Blood Gluc Transmit (DEXCOM G6 TRANSMITTER) MISC     . estradiol (ESTRACE) 2 MG tablet Take 2 mg by mouth every other day.    . finasteride (PROSCAR) 5 MG tablet Take 5 mg by mouth every other day.    Marland Kitchen  golimumab (SIMPONI ARIA) 50 MG/4ML SOLN injection Inject 12.5 mg into the vein every 8 (eight) weeks.    Marland Kitchen ketoconazole (NIZORAL) 2 % shampoo Apply 1 application topically daily.    Marland Kitchen leflunomide (ARAVA) 10 MG tablet Take 20 mg by mouth daily.    . minoxidil (LONITEN) 2.5 MG tablet Take 0.25 mg by mouth daily.    Marland Kitchen omeprazole (PRILOSEC) 20 MG capsule Take 2 capsules by mouth daily.    . progesterone (PROMETRIUM) 100 MG capsule Take 100 mg by mouth at bedtime.    .  rosuvastatin (CRESTOR) 5 MG tablet Take 5 mg by mouth daily.    Marland Kitchen spironolactone (ALDACTONE) 100 MG tablet Take 100 mg by mouth 2 (two) times daily.    Marland Kitchen HUMIRA PEN 40 MG/0.4ML PNKT SMARTSIG:40 Milligram(s) SUB-Q Every 2 Weeks (Patient not taking: Reported on 03/19/2021)      Blood pressure (!) 114/57, pulse 62, temperature 98.3 F (36.8 C), temperature source Oral, resp. rate 16, height  (1.6 m), weight 90.3 kg, SpO2 98 %. Physical Exam: Constitutional: NAD; conversant; no deformities Eyes: Moist conjunctiva; no lid lag; anicteric; PERRL Neck: Trachea midline; no thyromegaly Lungs: Normal respiratory effort; no tactile fremitus CV: RRR; no palpable thrills; no pitting edema GI: Abd soft, obese mild TTP epigastrium, no guarding, negative murphy's, no palpable hepatosplenomegaly MSK: Normal gait; no clubbing/cyanosis Psychiatric: Appropriate affect; alert and oriented x3 Lymphatic: No palpable cervical or axillary lymphadenopathy  Results for orders placed or performed during the hospital encounter of 03/18/21 (from the past 48 hour(s))  Resp Panel by RT-PCR (Flu A&B, Covid) Nasopharyngeal Swab     Status: None   Collection Time: 03/18/21 12:47 AM   Specimen: Nasopharyngeal Swab; Nasopharyngeal(NP) swabs in vial transport medium  Result Value Ref Range   SARS Coronavirus 2 by RT PCR NEGATIVE NEGATIVE    Comment: (NOTE) SARS-CoV-2 target nucleic acids are NOT DETECTED.  The SARS-CoV-2 RNA is generally detectable in upper respiratory specimens during the acute phase of infection. The lowest concentration of SARS-CoV-2 viral copies this assay can detect is 138 copies/mL. A negative result does not preclude SARS-Cov-2 infection and should not be used as the sole basis for treatment or other patient management decisions. A negative result may occur with  improper specimen collection/handling, submission of specimen other than nasopharyngeal swab, presence of viral mutation(s) within  the areas targeted by this assay, and inadequate number of viral copies(<138 copies/mL). A negative result must be combined with clinical observations, patient history, and epidemiological information. The expected result is Negative.  Fact Sheet for Patients:  BloggerCourse.com  Fact Sheet for Healthcare Providers:  SeriousBroker.it  This test is no t yet approved or cleared by the [place] FDA and  has been authorized for detection and/or diagnosis of SARS-CoV-2 by FDA under an Emergency Use Authorization (EUA). This EUA will remain  in effect (meaning this test can be used) for the duration of the COVID-19 declaration under Section 564(b)(1) of the Act, 21 U.S.C.section 360bbb-3(b)(1), unless the authorization is terminated  or revoked sooner.       Influenza A by PCR NEGATIVE NEGATIVE   Influenza B by PCR NEGATIVE NEGATIVE    Comment: (NOTE) The Xpert Xpress SARS-CoV-2/FLU/RSV plus assay is intended as an aid in the diagnosis of influenza from Nasopharyngeal swab specimens and should not be used as a sole basis for treatment. Nasal washings and aspirates are unacceptable for Xpert Xpress SARS-CoV-2/FLU/RSV testing.  Fact Sheet for Patients: BloggerCourse.com  Fact Sheet  for Healthcare Providers: SeriousBroker.it  This test is not yet approved or cleared by the [place] and has been authorized for detection and/or diagnosis of SARS-CoV-2 by FDA under an Emergency Use Authorization (EUA). This EUA will remain in effect (meaning this test can be used) for the duration of the COVID-19 declaration under Section 564(b)(1) of the Act, 21 U.S.C. section 360bbb-3(b)(1), unless the authorization is terminated or revoked.  Performed at [facility], [address] [place], [place] [address]   Lipase, blood     Status: None   Collection Time:  03/18/21  7:29 PM  Result Value Ref Range   Lipase 25 11 - 51 U/L    Comment: Performed at [facility], [address] [place], [place] [address]  Comprehensive metabolic panel     Status: Abnormal   Collection Time: 03/18/21  7:29 PM  Result Value Ref Range   Sodium 139 135 - 145 mmol/L   Potassium 3.8 3.5 - 5.1 mmol/L   Chloride 105 98 - 111 mmol/L   CO2 22 22 - 32 mmol/L   Glucose, Bld 187 (H) 70 - 99 mg/dL    Comment: Glucose reference range applies only to samples taken after fasting for at least 8 hours.   BUN 10 6 - 20 mg/dL   Creatinine, Ser 4.69 0.44 - 1.00 mg/dL   Calcium 9.5 8.9 - 62.9 mg/dL   Total Protein 7.4 6.5 - 8.1 g/dL   Albumin 4.3 3.5 - 5.0 g/dL   AST 20 15 - 41 U/L   ALT 27 0 - 44 U/L   Alkaline Phosphatase 69 38 - 126 U/L   Total Bilirubin 0.9 0.3 - 1.2 mg/dL   GFR, Estimated >52 >84 mL/min    Comment: (NOTE) Calculated using the CKD-EPI Creatinine Equation (2021)    Anion gap 12 5 - 15    Comment: Performed at [facility], [address] [place], [place] [address]  CBC     Status: Abnormal   Collection Time: 03/18/21  7:29 PM  Result Value Ref Range   WBC 15.7 (H) 4.0 - 10.5 K/uL   RBC 5.78 (H) 3.87 - 5.11 MIL/uL   Hemoglobin 17.0 (H) 12.0 - 15.0 g/dL   HCT 01.0 (H) 27.2 - 53.6 %   MCV 89.4 80.0 - 100.0 fL   MCH 29.4 26.0 - 34.0 pg   MCHC 32.9 30.0 - 36.0 g/dL   RDW 64.4 03.4 - 74.2 %   Platelets 278 150 - 400 K/uL   nRBC 0.0 0.0 - 0.2 %    Comment: Performed at [facility], [address] [place], [place] [address]  Differential     Status: Abnormal   Collection Time: 03/18/21  7:29 PM  Result Value Ref Range   Neutrophils Relative % 80 %   Neutro Abs 12.6 (H) 1.7 - 7.7 K/uL   Lymphocytes Relative 12 %   Lymphs Abs 1.9 0.7 - 4.0 K/uL   Monocytes Relative 7 %   Monocytes Absolute 1.0 0.1 - 1.0 K/uL   Eosinophils Relative 0 %   Eosinophils Absolute 0.0 0.0 - 0.5 K/uL   Basophils  Relative 0 %   Basophils Absolute 0.1 0.0 - 0.1 K/uL   Immature Granulocytes 1 %   Abs Immature Granulocytes 0.12 (H) 0.00 - 0.07 K/uL    Comment: Performed at [facility], [address] [place], [place] [address]  Urinalysis, Routine w reflex  microscopic Urine, Clean Catch     Status: Abnormal   Collection Time: 03/18/21  8:06 PM  Result Value Ref Range   Color, Urine AMBER (A) YELLOW    Comment: BIOCHEMICALS MAY BE AFFECTED BY COLOR   APPearance TURBID (A) CLEAR   Specific Gravity, Urine 1.028 1.005 - 1.030   pH 5.0 5.0 - 8.0   Glucose, UA 150 (A) NEGATIVE mg/dL   Hgb urine dipstick NEGATIVE NEGATIVE   Bilirubin Urine NEGATIVE NEGATIVE   Ketones, ur 80 (A) NEGATIVE mg/dL   Protein, ur 30 (A) NEGATIVE mg/dL   Nitrite NEGATIVE NEGATIVE   Leukocytes,Ua NEGATIVE NEGATIVE   RBC / HPF 0-5 0 - 5 RBC/hpf   WBC, UA 0-5 0 - 5 WBC/hpf   Bacteria, UA RARE (A) NONE SEEN   Squamous Epithelial / LPF 6-10 0 - 5   Mucus PRESENT     Comment: Performed at [facility], [address], [place], [place] [address]  CBG monitoring, ED     Status: Abnormal   Collection Time: 03/18/21  8:43 PM  Result Value Ref Range   Glucose-Capillary 208 (H) 70 - 99 mg/dL    Comment: Glucose reference range applies only to samples taken after fasting for at least 8 hours.   Comment 1 Notify RN   Glucose, capillary     Status: Abnormal   Collection Time: 03/19/21  1:18 AM  Result Value Ref Range   Glucose-Capillary 159 (H) 70 - 99 mg/dL    Comment: Glucose reference range applies only to samples taken after fasting for at least 8 hours.  Comprehensive metabolic panel     Status: Abnormal   Collection Time: 03/19/21  3:59 AM  Result Value Ref Range   Sodium 137 135 - 145 mmol/L   Potassium 3.7 3.5 - 5.1 mmol/L   Chloride 108 98 - 111 mmol/L   CO2 22 22 - 32 mmol/L   Glucose, Bld 116 (H) 70 - 99 mg/dL    Comment: Glucose reference range applies only to samples taken  after fasting for at least 8 hours.   BUN 9 6 - 20 mg/dL   Creatinine, Ser 5.63 0.44 - 1.00 mg/dL   Calcium 8.6 (L) 8.9 - 10.3 mg/dL   Total Protein 6.0 (L) 6.5 - 8.1 g/dL   Albumin 3.4 (L) 3.5 - 5.0 g/dL   AST 14 (L) 15 - 41 U/L   ALT 21 0 - 44 U/L   Alkaline Phosphatase 57 38 - 126 U/L   Total Bilirubin 0.9 0.3 - 1.2 mg/dL   GFR, Estimated >87 >56 mL/min    Comment: (NOTE) Calculated using the CKD-EPI Creatinine Equation (2021)    Anion gap 7 5 - 15    Comment: Performed at [facility], [address], [place], [place] [address]  CBC     Status: Abnormal   Collection Time: 03/19/21  3:59 AM  Result Value Ref Range   WBC 13.8 (H) 4.0 - 10.5 K/uL   RBC 4.83 3.87 - 5.11 MIL/uL   Hemoglobin 14.3 12.0 - 15.0 g/dL   HCT 51.8 84.1 - 66.0 %   MCV 91.1 80.0 - 100.0 fL   MCH 29.6 26.0 - 34.0 pg   MCHC 32.5 30.0 - 36.0 g/dL   RDW 63.0 16.0 - 10.9 %   Platelets 222 150 - 400 K/uL   nRBC 0.0 0.0 - 0.2 %    Comment: Performed at [facility], [address], [place], [place] [address]  Glucose, capillary     Status: Abnormal   Collection Time: 03/19/21  7:44 AM  Result Value Ref Range   Glucose-Capillary 115 (H) 70 - 99 mg/dL    Comment: Glucose reference range applies only to samples taken after fasting for at least 8 hours.   No results found.  Assessment/Plan DM2 on insulin RA - last Simponi infusion 3/16 Ankylosis spondylitis   Worsening biliary colic, likely chronic cholecystitis  This patient has known cholelithiasis and presents with more frequent episodes of epigastric abdominal pain, nausea, and emesis. Her symptoms are exacerbated by acidic foods. Her nausea/vomiting is now occurring even without PO intake and is refractory to antiemetics. I think she has worsening biliary colic and likely chronic cholecystitis. Recommend laparoscopic cholecystectomy today by Dr. Derrell Lolling.   Risk and benefits of surgery were discussion with the patient  including bleeding, infection, wound infection, damage to surrounding structures, conversion to open, biliary leak, need for additional procedures and the patient would like to proceed with surgery.   NPO, IVF, IV abx    Adam Phenix, [facility] Surgery Please see Amion for pager number during day hours 7:00am-4:30pm 03/19/2021, 8:45 AM

## 2021-03-19 NOTE — Op Note (Signed)
03/19/2021  5:06 PM  PATIENT:  Jacqueline Coleman  60 y.o. female  PRE-OPERATIVE DIAGNOSIS:  Symtematic Cholelithasis  POST-OPERATIVE DIAGNOSIS:  Symtematic Cholelithasis  PROCEDURE:  Procedure(s): LAPAROSCOPIC CHOLECYSTECTOMY (N/A)  SURGEON:  Surgeon(s) and Role:    * Axel Filler, MD - Primary  ASSISTANT: Isabel Caprice, MD PGY-6  ANESTHESIA:   local and general  EBL:  none   BLOOD ADMINISTERED:none  DRAINS: none   LOCAL MEDICATIONS USED:  BUPIVICAINE   SPECIMEN:  Source of Specimen:  gallbladder  DISPOSITION OF SPECIMEN:  PATHOLOGY  COUNTS:  YES  TOURNIQUET:  * No tourniquets in log *  DICTATION: .Dragon Dictation Findings: Acute cholecsytitis and cholelithiasis   The patient was taken to the operating and placed in the supine position with bilateral SCDs in place.  The patient was prepped and draped in the usual sterile fashion. A time out was called and all facts were verified. A pneumoperitoneum was obtained via A Veress needle technique to a pressure of 97mm of mercury.  A 41mm trochar was then placed in the right upper quadrant under visualization, and there were no injuries to any abdominal organs. A 11 mm port was then placed in the umbilical region after infiltrating with local anesthesia under direct visualization. A second and third epigastric port and right lower quadrant port placement under direct visualization, respectively.    The gallbladder was identified and retracted, the peritoneum was then sharply dissected from the gallbladder and this dissection was carried down to Calot's triangle. The cystic duct was identified and stripped away circumferentially and seen going into the gallbladder 360, the critical angle was obtained.  2 clips were placed proximally one distally and the cystic duct transected. The cystic artery was identified and 2 clips placed proximally and one distally and transected.  We then proceeded to remove the gallbladder off the  hepatic fossa with Bovie cautery. A retrieval bag was then placed in the abdomen and gallbladder placed in the bag. The hepatic fossa was then reexamined and hemostasis was achieved with Bovie cautery and was excellent at the end of the case.   The subhepatic fossa and perihepatic fossa was then irrigated until the effluent was clear.  The gallbladder and bag were removed from the abdominal cavity. The 11 mm trocar fascia was reapproximated with the Endo Close #1 Vicryl x3.  The pneumoperitoneum was evacuated and all trochars removed under direct visulalization.  The skin was then closed with 4-0 Monocryl and the skin dressed with Dermabond.    The patient was awaken from general anesthesia and taken to the recovery room in stable condition.  PLAN OF CARE: Admit for overnight observation  PATIENT DISPOSITION:  PACU - hemodynamically stable.   Delay start of Pharmacological VTE agent (>24hrs) due to surgical blood loss or risk of bleeding: not applicable

## 2021-03-19 NOTE — ED Notes (Signed)
ED TO INPATIENT HANDOFF REPORT  Name/Age/Gender Jacqueline Coleman 60 y.o. female  Code Status    Code Status Orders  (From admission, onward)         Start     Ordered   03/19/21 0030  Full code  Continuous        03/19/21 0030        Code Status History    This patient has a current code status but no historical code status.   Advance Care Planning Activity    Advance Directive Documentation   Flowsheet Row Most Recent Value  Type of Advance Directive Healthcare Power of Attorney, Living will  Pre-existing out of facility DNR order (yellow form or pink MOST form) --  "MOST" Form in Place? --      Home/SNF/Other Home  Chief Complaint Abdominal pain [R10.9]  Level of Care/Admitting Diagnosis ED Disposition    ED Disposition Condition Comment   Admit  Hospital Area: [facility] [[id number]]  Level of Care: Med-Surg [16]  Covid Evaluation: Asymptomatic Screening Protocol (No Symptoms)  Diagnosis: Abdominal pain [744753]  Admitting Physician: Anselm Jungling [[id number]]  Attending Physician: Anselm Jungling [[id number]]       Medical History Past Medical History:  Diagnosis Date  . Ankylosing spondylitis (HCC)   . Diabetes mellitus without complication (HCC)   . Osteoporosis   . Rheumatoid arthritis (HCC)     Allergies Allergies  Allergen Reactions  . Methotrexate Other (See Comments)    Put pt in DKA  . Adhesive [Tape] Other (See Comments)    Blister    . Codeine Itching  . Farxiga [Dapagliflozin]   . Lisinopril Other (See Comments)    Back pain  . Losartan Other (See Comments)    Back pain   . Prednisone Other (See Comments)    Adverse reaction     IV Location/Drains/Wounds Patient Lines/Drains/Airways Status    Active Line/Drains/Airways    Name Placement date Placement time Site Days   Peripheral IV 03/18/21 Left;Upper Forearm 03/18/21  1928  Forearm  1          Labs/Imaging Results for orders placed or performed during  the hospital encounter of 03/18/21 (from the past 48 hour(s))  Lipase, blood     Status: None   Collection Time: 03/18/21  7:29 PM  Result Value Ref Range   Lipase 25 11 - 51 U/L    Comment: Performed at [facility], [address] [place], [place] [address]  Comprehensive metabolic panel     Status: Abnormal   Collection Time: 03/18/21  7:29 PM  Result Value Ref Range   Sodium 139 135 - 145 mmol/L   Potassium 3.8 3.5 - 5.1 mmol/L   Chloride 105 98 - 111 mmol/L   CO2 22 22 - 32 mmol/L   Glucose, Bld 187 (H) 70 - 99 mg/dL    Comment: Glucose reference range applies only to samples taken after fasting for at least 8 hours.   BUN 10 6 - 20 mg/dL   Creatinine, Ser 7.32 0.44 - 1.00 mg/dL   Calcium 9.5 8.9 - 20.2 mg/dL   Total Protein 7.4 6.5 - 8.1 g/dL   Albumin 4.3 3.5 - 5.0 g/dL   AST 20 15 - 41 U/L   ALT 27 0 - 44 U/L   Alkaline Phosphatase 69 38 - 126 U/L   Total Bilirubin 0.9 0.3 - 1.2 mg/dL   GFR, Estimated >54 >27 mL/min  Comment: (NOTE) Calculated using the CKD-EPI Creatinine Equation (2021)    Anion gap 12 5 - 15    Comment: Performed at [facility], [address], [place], [place] [address]  CBC     Status: Abnormal   Collection Time: 03/18/21  7:29 PM  Result Value Ref Range   WBC 15.7 (H) 4.0 - 10.5 K/uL   RBC 5.78 (H) 3.87 - 5.11 MIL/uL   Hemoglobin 17.0 (H) 12.0 - 15.0 g/dL   HCT 26.7 (H) 12.4 - 58.0 %   MCV 89.4 80.0 - 100.0 fL   MCH 29.4 26.0 - 34.0 pg   MCHC 32.9 30.0 - 36.0 g/dL   RDW 99.8 33.8 - 25.0 %   Platelets 278 150 - 400 K/uL   nRBC 0.0 0.0 - 0.2 %    Comment: Performed at [facility], [address] [place], [place] [address]  Differential     Status: Abnormal   Collection Time: 03/18/21  7:29 PM  Result Value Ref Range   Neutrophils Relative % 80 %   Neutro Abs 12.6 (H) 1.7 - 7.7 K/uL   Lymphocytes Relative 12 %   Lymphs Abs 1.9 0.7 - 4.0 K/uL   Monocytes Relative 7 %    Monocytes Absolute 1.0 0.1 - 1.0 K/uL   Eosinophils Relative 0 %   Eosinophils Absolute 0.0 0.0 - 0.5 K/uL   Basophils Relative 0 %   Basophils Absolute 0.1 0.0 - 0.1 K/uL   Immature Granulocytes 1 %   Abs Immature Granulocytes 0.12 (H) 0.00 - 0.07 K/uL    Comment: Performed at [facility], [address] [place], [place] [address]  Urinalysis, Routine w reflex microscopic Urine, Clean Catch     Status: Abnormal   Collection Time: 03/18/21  8:06 PM  Result Value Ref Range   Color, Urine AMBER (A) YELLOW    Comment: BIOCHEMICALS MAY BE AFFECTED BY COLOR   APPearance TURBID (A) CLEAR   Specific Gravity, Urine 1.028 1.005 - 1.030   pH 5.0 5.0 - 8.0   Glucose, UA 150 (A) NEGATIVE mg/dL   Hgb urine dipstick NEGATIVE NEGATIVE   Bilirubin Urine NEGATIVE NEGATIVE   Ketones, ur 80 (A) NEGATIVE mg/dL   Protein, ur 30 (A) NEGATIVE mg/dL   Nitrite NEGATIVE NEGATIVE   Leukocytes,Ua NEGATIVE NEGATIVE   RBC / HPF 0-5 0 - 5 RBC/hpf   WBC, UA 0-5 0 - 5 WBC/hpf   Bacteria, UA RARE (A) NONE SEEN   Squamous Epithelial / LPF 6-10 0 - 5   Mucus PRESENT     Comment: Performed at [facility], [address] [place], [place] [address]  CBG monitoring, ED     Status: Abnormal   Collection Time: 03/18/21  8:43 PM  Result Value Ref Range   Glucose-Capillary 208 (H) 70 - 99 mg/dL    Comment: Glucose reference range applies only to samples taken after fasting for at least 8 hours.   Comment 1 Notify RN    No results found.  Pending Labs Unresulted Labs (From admission, onward)          Start     Ordered   03/19/21 0500  Comprehensive metabolic panel  Tomorrow morning,   R        03/19/21 0030   03/19/21 0500  CBC  Tomorrow morning,   R        03/19/21 0030   03/18/21 2350  Resp Panel  by RT-PCR (Flu A&B, Covid) Nasopharyngeal Swab  (Tier 2 - Symptomatic/asymptomatic with Precautions )  Once,   STAT       Question Answer Comment  Is this test for diagnosis  or screening Screening   Symptomatic for COVID-19 as defined by CDC No   Hospitalized for COVID-19 No   Admitted to ICU for COVID-19 No   Previously tested for COVID-19 Unknown   Resident in a congregate (group) care setting No   Employed in healthcare setting No   Pregnant No   Has patient completed COVID vaccination(s) (2 doses of Pfizer/Moderna 1 dose of [employer]) Unknown      03/18/21 2349          Vitals/Pain Today's Vitals   03/18/21 2215 03/18/21 2245 03/18/21 2330 03/19/21 0051  BP: (!) 156/53 112/60 139/61   Pulse: (!) 53 61 88   Resp: 20 18 18    Temp:  98.1 F (36.7 C)    TempSrc:  Oral    SpO2: 94% 96% 94%   Weight:      Height:      PainSc:    0-No pain    Isolation Precautions Airborne and Contact precautions  Medications Medications  0.9 %  sodium chloride infusion ( Intravenous New Bag/Given 03/18/21 2130)  insulin aspart (novoLOG) injection 0-15 Units (has no administration in time range)  ondansetron (ZOFRAN) injection 4 mg (has no administration in time range)  acetaminophen (TYLENOL) tablet 650 mg (has no administration in time range)  leflunomide (ARAVA) tablet 20 mg (has no administration in time range)  HYDROmorphone (DILAUDID) injection 0.5 mg (has no administration in time range)  sodium chloride 0.9 % bolus 1,000 mL (0 mLs Intravenous Stopped 03/19/21 0030)  HYDROmorphone (DILAUDID) injection 1 mg (1 mg Intravenous Given 03/18/21 2132)  ondansetron (ZOFRAN) injection 4 mg (4 mg Intravenous Given 03/18/21 2130)    Mobility walks

## 2021-03-19 NOTE — Anesthesia Preprocedure Evaluation (Addendum)
Anesthesia Evaluation  Patient identified by MRN, date of birth, ID band Patient awake    Reviewed: Allergy & Precautions, H&P , NPO status , Patient's Chart, lab work & pertinent test results, reviewed documented beta blocker date and time   Airway Mallampati: I  TM Distance: >3 FB Neck ROM: full    Dental no notable dental hx. (+) Teeth Intact, Dental Advisory Given   Pulmonary neg pulmonary ROS, Current Smoker,    Pulmonary exam normal breath sounds clear to auscultation       Cardiovascular Exercise Tolerance: Good  Rhythm:regular Rate:Normal     Neuro/Psych  Neuromuscular disease negative psych ROS   GI/Hepatic GERD  Medicated,(+)     substance abuse  marijuana use,   Endo/Other  diabetes, Poorly Controlled, Type obesity  Renal/GU   negative genitourinary   Musculoskeletal  (+) Arthritis , Osteoarthritis and Rheumatoid disorders,    Abdominal   Peds  Hematology negative hematology ROS (+)   Anesthesia Other Findings   Reproductive/Obstetrics negative OB ROS                            Anesthesia Physical Anesthesia Plan  ASA: III and emergent  Anesthesia Plan: General   Post-op Pain Management:    Induction: Cricoid pressure planned and Intravenous  PONV Risk Score and Plan: 3 and Ondansetron, Treatment may vary due to age or medical condition and Midazolam  Airway Management Planned: Oral ETT  Additional Equipment:   Intra-op Plan:   Post-operative Plan: Extubation in OR  Informed Consent: I have reviewed the patients History and Physical, chart, labs and discussed the procedure including the risks, benefits and alternatives for the proposed anesthesia with the patient or authorized representative who has indicated his/her understanding and acceptance.     Dental Advisory Given  Plan Discussed with: CRNA  Anesthesia Plan Comments: (  )        Anesthesia Quick Evaluation

## 2021-03-19 NOTE — H&P (Signed)
History and Physical    Jacqueline Coleman ZYS:[id number] DOB: July 27, 1961 DOA: 03/18/2021  PCP: Tally Joe, MD  Patient coming from: Home  I have personally briefly reviewed patient's old medical records in [facility]  Chief Complaint: Abdominal pain, nausea and vomiting  HPI: Jacqueline Coleman is a 60 y.o. female with medical history significant for type 2 diabetes on insulin pump, rheumatoid arthritis, ankylosing spondylitis, and obesity who presents with abdominal pain, nausea and vomiting.  Patient reports that she has been having epigastric and right upper quadrant abdominal pain, nausea and vomiting since 2020 with several hospital admissions.  Has been thought to be due to gastroparesis from her diabetes.  Pain is worse with palpation but not necessarily associated with food.  She has sensation of abdominal fullness to that area despite having an empty stomach.  She usually wakes up with bouts of nausea and bilious vomiting before eating any food.  No fever but endorses chills and diaphoresis with panic attack at times.  Sometimes also gets nauseous and vomits when she moves her head too fast which she feels is associated with anxiety.  She also feels like she gets a "buzzing sensation" that comes over her prior to her episodes of nausea and vomiting. Notes her sugars have to waxing and waning and goes low down to 40s in the morning at times and causes her to have some vision changes.   She endorses smoking 10 cigarettes and has marijuana gummy and CBD occasionally.  Denies any alcohol use.  She was finally referred to [facility] early this week and had an abdominal ultrasound on 4/7 that showed cholelithiasis without acute cholecystitis.  She initially had planned referral to outpatient surgery but today again developed worsening abdominal pain with nausea and vomiting and decided to present to the ED.  ED Course: She was afebrile and initially hypertensive up to systolic  of 160s.  CBC shows leukocytosis of 15.7 which is chronic.  Hemoglobin of 7.  BG of 187.  ED physician discussed case with general surgery who recommend hospitalist admission and they will see in consultation tomorrow.  No further imaging recommended.  Review of Systems: Constitutional: No Weight Change, No Fever ENT/Mouth: No sore throat, No Rhinorrhea Eyes: No Eye Pain, No Vision Changes Cardiovascular: No Chest Pain, no SOB Respiratory: No Cough, No Sputum, No Wheezing, no Dyspnea  Gastrointestinal: No Nausea, No Vomiting, No Diarrhea, No Constipation, No Pain Genitourinary: no Urinary Incontinence, No Urgency, No Flank Pain Musculoskeletal: No Arthralgias, No Myalgias Skin: No Skin Lesions, No Pruritus, Neuro: no Weakness, No Numbness Psych: No Anxiety/Panic, No Depression, + decrease appetite Heme/Lymph: No Bruising, No Bleeding  Past Medical History:  Diagnosis Date  . Ankylosing spondylitis (HCC)   . Diabetes mellitus without complication (HCC)   . Osteoporosis   . Rheumatoid arthritis [facility])     Past Surgical History:  Procedure Laterality Date  . CARPAL TUNNEL RELEASE Right 2003  . CESAREAN SECTION       reports that she has been smoking cigarettes. She has a 4.50 pack-year smoking history. She has never used smokeless tobacco. She reports previous alcohol use. She reports current drug use. Drug: Marijuana. Social History  Allergies  Allergen Reactions  . Methotrexate Other (See Comments)    Put pt in DKA  . Adhesive [Tape] Other (See Comments)    Blister    . Codeine Itching  . Farxiga [Dapagliflozin]   . Lisinopril Other (See Comments)    Back pain  .  Losartan Other (See Comments)    Back pain   . Prednisone Other (See Comments)    Adverse reaction     Family History  Problem Relation Age of Onset  . Diabetes Mother   . Diabetes Father   . Breast cancer Neg Hx      Prior to Admission medications   Medication Sig Start Date End Date Taking?  Authorizing Provider  Continuous Blood Gluc Sensor (DEXCOM G6 SENSOR) MISC SMARTSIG:1 Each Topical Every 10 Days 08/12/20   [provider]  Continuous Blood Gluc Transmit (DEXCOM G6 TRANSMITTER) MISC  05/28/20   [provider]  estradiol (ESTRACE) 2 MG tablet Take 2 mg by mouth every other day. 02/28/19   [provider]  finasteride (PROSCAR) 5 MG tablet Take 5 mg by mouth every other day. 08/06/19   [provider]  gabapentin (NEURONTIN) 100 MG capsule Take 100 mg by mouth daily. 04/23/20   [provider]  HUMALOG 100 UNIT/ML injection Inject into the skin. 07/13/20   [provider]  HUMIRA PEN 40 MG/0.4ML PNKT SMARTSIG:40 Milligram(s) SUB-Q Every 2 Weeks 03/09/20   [provider]  ketoconazole (NIZORAL) 2 % shampoo Apply 1 application topically daily. 08/11/19   [provider]  leflunomide (ARAVA) 10 MG tablet Take 10 mg by mouth daily. 07/08/20   [provider]  progesterone (PROMETRIUM) 100 MG capsule Take 100 mg by mouth every other day. 02/28/19   [provider]  progesterone (PROMETRIUM) 100 MG capsule Take 100 mg by mouth at bedtime. 07/08/20   [provider]  spironolactone (ALDACTONE) 100 MG tablet Take 100 mg by mouth 2 (two) times daily. 08/06/19   [provider]    Physical Exam: Vitals:   03/18/21 2130 03/18/21 2215 03/18/21 2245 03/18/21 2330  BP: (!) 149/72 (!) 156/53 112/60 139/61  Pulse: (!) 48 (!) 53 61 88  Resp: 18 20 18 18   Temp:   98.1 F (36.7 C)   TempSrc:   Oral   SpO2: 99% 94% 96% 94%  Weight:      Height:        Constitutional: NAD, calm, comfortable, obese female laying flat in bed Vitals:   03/18/21 2130 03/18/21 2215 03/18/21 2245 03/18/21 2330  BP: (!) 149/72 (!) 156/53 112/60 139/61  Pulse: (!) 48 (!) 53 61 88  Resp: 18 20 18 18   Temp:   98.1 F (36.7 C)   TempSrc:   Oral   SpO2: 99% 94% 96% 94%  Weight:      Height:       Eyes: PERRL, lids  and conjunctivae normal ENMT: Mucous membranes are moist.  Neck: normal, supple Respiratory: clear to auscultation bilaterally, no wheezing, no crackles. Normal respiratory effort. No accessory muscle use.  Cardiovascular: Regular rate and rhythm, no murmurs / rubs / gallops. No extremity edema.  Abdomen: no tenderness, rebound tenderness guarding or rigidity, no masses palpated.  Bowel sounds positive.  Musculoskeletal: no clubbing / cyanosis. No joint deformity upper and lower extremities. Good ROM, no contractures. Normal muscle tone.  Skin: no rashes, lesions, ulcers. No induration Neurologic: CN 2-12 grossly intact. Sensation intact,Strength 5/5 in all 4.  Psychiatric: Normal judgment and insight. Alert and oriented x 3. Normal mood.     Labs on Admission: I have personally reviewed following labs and imaging studies  CBC: Recent Labs  Lab 03/18/21 1929  WBC 15.7*  NEUTROABS 12.6*  HGB 17.0*  HCT 51.7*  MCV 89.4  PLT 278   Basic Metabolic Panel: Recent Labs  Lab 03/18/21 1929  NA 139  K 3.8  CL 105  CO2 22  GLUCOSE 187*  BUN 10  CREATININE 0.70  CALCIUM 9.5   GFR: Estimated Creatinine Clearance: 81.8 mL/min (by C-G formula based on SCr of 0.7 mg/dL). Liver Function Tests: Recent Labs  Lab 03/18/21 1929  AST 20  ALT 27  ALKPHOS 69  BILITOT 0.9  PROT 7.4  ALBUMIN 4.3   Recent Labs  Lab 03/18/21 1929  LIPASE 25   No results for input(s): AMMONIA in the last 168 hours. Coagulation Profile: No results for input(s): INR, PROTIME in the last 168 hours. Cardiac Enzymes: No results for input(s): CKTOTAL, CKMB, CKMBINDEX, TROPONINI in the last 168 hours. BNP (last 3 results) No results for input(s): PROBNP in the last 8760 hours. HbA1C: No results for input(s): HGBA1C in the last 72 hours. CBG: Recent Labs  Lab 03/18/21 2043  GLUCAP 208*   Lipid Profile: No results for input(s): CHOL, HDL, LDLCALC, TRIG, CHOLHDL, LDLDIRECT in the last 72  hours. Thyroid Function Tests: No results for input(s): TSH, T4TOTAL, FREET4, T3FREE, THYROIDAB in the last 72 hours. Anemia Panel: No results for input(s): VITAMINB12, FOLATE, FERRITIN, TIBC, IRON, RETICCTPCT in the last 72 hours. Urine analysis:    Component Value Date/Time   COLORURINE AMBER (A) 03/18/2021 2006   APPEARANCEUR TURBID (A) 03/18/2021 2006   LABSPEC 1.028 03/18/2021 2006   PHURINE 5.0 03/18/2021 2006   GLUCOSEU 150 (A) 03/18/2021 2006   HGBUR NEGATIVE 03/18/2021 2006   BILIRUBINUR NEGATIVE 03/18/2021 2006   KETONESUR 80 (A) 03/18/2021 2006   PROTEINUR 30 (A) 03/18/2021 2006   NITRITE NEGATIVE 03/18/2021 2006   LEUKOCYTESUR NEGATIVE 03/18/2021 2006    Radiological Exams on Admission: No results found.    Assessment/Plan  Abdominal pain with cholelithiasis concerning for prolonged biliary colic -Patient with worsening abdominal pain, nausea and vomiting with recent abdominal ultrasound on 4/7 showing cholelithiasis without acute cholecystitis -Surgery is aware and will see in consultation -Patient afebrile and has baseline leukocytosis that has been chronic for years.  I do not feel that she warrants any antibiotics at this point.  Low threshold to start if she does develop a fever. -keep NPO for now pending further surgery recommendations -Pt has codeine allergy but tolerated dose of Dilaudid well in ED. PRN 0.5mg  Dilaudid q4hr   Type 2 DM  hold insulin pump Reports HbA1C last month of 8 will place on moderate SSI with meals, qHS and at 0200  Rheumatoid arthritis/ankylosing spondylitis continue lefunomide   Obesity BMI of 35.  Complicates clinical course  DVT prophylaxis:.SCDs Code Status: Full Family Communication: Plan discussed with patient and husband at bedside  disposition Plan: Home with observation Consults called:  Admission status: Observation   Level of care: Med-Surg  Status is: Observation  The patient remains OBS appropriate and  will d/c before 2 midnights.  Dispo: The patient is from: Home              Anticipated d/c is to: Home              Patient currently is not medically stable for discharge   Difficult to place patient No         Anselm Jungling DO [facility]   If 7PM-7AM, please contact night-coverage [email or web address]   03/19/2021, 12:31 AM

## 2021-03-19 NOTE — Anesthesia Postprocedure Evaluation (Signed)
Anesthesia Post Note  Patient: Jacqueline Coleman  Procedure(s) Performed: LAPAROSCOPIC CHOLECYSTECTOMY (N/A )     Patient location during evaluation: PACU Anesthesia Type: General Level of consciousness: awake and alert Pain management: pain level controlled Vital Signs Assessment: post-procedure vital signs reviewed and stable Respiratory status: spontaneous breathing, nonlabored ventilation, respiratory function stable and patient connected to nasal cannula oxygen Cardiovascular status: blood pressure returned to baseline and stable Postop Assessment: no apparent nausea or vomiting Anesthetic complications: no   No complications documented.  Last Vitals:  Vitals:   03/19/21 1800 03/19/21 1821  BP: (!) 145/70 (!) 164/67  Pulse: (!) 50 61  Resp: 11 18  Temp:  36.9 C  SpO2: 98% 92%    Last Pain:  Vitals:   03/19/21 1834  TempSrc:   PainSc: 9                  Kendal Raffo

## 2021-03-19 NOTE — Progress Notes (Signed)
PROGRESS NOTE  Jacqueline Coleman:[id number] DOB: 1960/12/11   PCP: Tally Joe, MD  Patient is from: Home  DOA: 03/18/2021 LOS: 0  Chief complaints: Nausea, vomiting and abdominal pain  Brief Narrative / Interim history: 60 year old F with PMH of IDDM-2 on insulin pump, RA, ankylosing spondylitis, morbid obesity, tobacco use disorder and marijuana use presenting with acute on chronic nausea, bilious emesis and abdominal pain, and admitted for biliary colic.  She had RUQ [place] ordered 4/7 that showed cholelithiasis.  She tried p.o. Phenergan without improvement and directed to ED by GI.  Subjective: Seen and examined earlier this morning.  Continues to endorse nausea, "tenderness" and lightheadedness.  She has not had emesis since midnight.  She denies chest pain or dyspnea.  Objective: Vitals:   03/19/21 0111 03/19/21 0441 03/19/21 0441 03/19/21 0934  BP: (!) 129/100 (!) 114/57 (!) 114/57 (!) 118/52  Pulse: 75 63 62 (!) 56  Resp: 16 16 16 18   Temp: 98.3 F (36.8 C) 98.3 F (36.8 C) 98.3 F (36.8 C) 98.4 F (36.9 C)  TempSrc: Oral Oral Oral Oral  SpO2: 97% 98% 98% 97%  Weight:      Height:        Intake/Output Summary (Last 24 hours) at 03/19/2021 1043 Last data filed at 03/19/2021 0458 Gross per 24 hour  Intake 352.47 ml  Output 1 ml  Net 351.47 ml   Filed Weights   03/18/21 1903  Weight: 90.3 kg    Examination:  GENERAL: No apparent distress.  Nontoxic. HEENT: MMM.  Vision and hearing grossly intact.  NECK: Supple.  No apparent JVD.  RESP:  No IWOB.  Fair aeration bilaterally. CVS:  RRR. Heart sounds normal.  ABD/GI/GU: BS+. Abd soft.  RUQ tenderness.  Murphy sign is positive. MSK/EXT:  Moves extremities. No apparent deformity. No edema.  SKIN: no apparent skin lesion or wound NEURO: Awake, alert and oriented appropriately.  No apparent focal neuro deficit. PSYCH: Calm. Normal affect.  Procedures:  None yet  Microbiology summarized: COVID-19 and  influenza PCR nonreactive.  Assessment & Plan: Nausea/vomiting/abdominal pain likely biliary colic: RUQ 05/18/21 on 4/7 with cholelithiasis.  Has no fever but still leukocytosis.  Seems she has positive Murphy sign on exam. -General surgery following-plan for lap chole today. -Continue IV fluid, analgesics and antiemetics. -Remains n.p.o.  Uncontrolled IDDM-2 with hyperglycemia: A1c 7.4%.  Uses insulin pump at home. Recent Labs  Lab 03/18/21 2043 03/19/21 0118 03/19/21 0744  GLUCAP 208* 159* 115*  -Continue SSI-moderate  Rheumatoid arthritis/ankylosing spondylitis: On Arava and Simponi injection outpatient. -Continue lefunomide   Tobacco use disorder: Reportedly smokes about 10 cigarettes a day. -Encourage tobacco cessation  Marijuana use: Reports using CBD almost every other day.  She is aware of cannabinoid related hyperemesis  Morbid obesity-meets criteria with diagnosis of diabetes Body mass index is 35.25 kg/m.  -Encourage lifestyle change to lose weight.       DVT prophylaxis:  SCDs Start: 03/19/21 0030  Code Status: Full code Family Communication: Patient and/or RN. Available if any question.  Level of care: Med-Surg Status is: Observation  The patient will require care spanning > 2 midnights and should be moved to inpatient because: Ongoing diagnostic testing needed not appropriate for outpatient work up, IV treatments appropriate due to intensity of illness or inability to take PO and Inpatient level of care appropriate due to severity of illness  Dispo: The patient is from: Home  Anticipated d/c is to: Home              Patient currently is not medically stable to d/c.   Difficult to place patient No     Consultants:  General surgery   Sch Meds:  Scheduled Meds: . insulin aspart  0-15 Units Subcutaneous TID PC,HS,0200  . leflunomide  20 mg Oral QHS   Continuous Infusions: . sodium chloride 100 mL/hr at 03/18/21 2130   PRN  Meds:.acetaminophen, HYDROmorphone (DILAUDID) injection, ondansetron (ZOFRAN) IV  Antimicrobials: Anti-infectives (From admission, onward)   None       I have personally reviewed the following labs and images: CBC: Recent Labs  Lab 03/18/21 1929 03/19/21 0359  WBC 15.7* 13.8*  NEUTROABS 12.6*  --   HGB 17.0* 14.3  HCT 51.7* 44.0  MCV 89.4 91.1  PLT 278 222   BMP &GFR Recent Labs  Lab 03/18/21 1929 03/19/21 0359  NA 139 137  K 3.8 3.7  CL 105 108  CO2 22 22  GLUCOSE 187* 116*  BUN 10 9  CREATININE 0.70 0.52  CALCIUM 9.5 8.6*   Estimated Creatinine Clearance: 81.8 mL/min (by C-G formula based on SCr of 0.52 mg/dL). Liver & Pancreas: Recent Labs  Lab 03/18/21 1929 03/19/21 0359  AST 20 14*  ALT 27 21  ALKPHOS 69 57  BILITOT 0.9 0.9  PROT 7.4 6.0*  ALBUMIN 4.3 3.4*   Recent Labs  Lab 03/18/21 1929  LIPASE 25   No results for input(s): AMMONIA in the last 168 hours. Diabetic: Recent Labs    03/19/21 0359  HGBA1C 7.4*   Recent Labs  Lab 03/18/21 2043 03/19/21 0118 03/19/21 0744  GLUCAP 208* 159* 115*   Cardiac Enzymes: No results for input(s): CKTOTAL, CKMB, CKMBINDEX, TROPONINI in the last 168 hours. No results for input(s): PROBNP in the last 8760 hours. Coagulation Profile: No results for input(s): INR, PROTIME in the last 168 hours. Thyroid Function Tests: No results for input(s): TSH, T4TOTAL, FREET4, T3FREE, THYROIDAB in the last 72 hours. Lipid Profile: No results for input(s): CHOL, HDL, LDLCALC, TRIG, CHOLHDL, LDLDIRECT in the last 72 hours. Anemia Panel: No results for input(s): VITAMINB12, FOLATE, FERRITIN, TIBC, IRON, RETICCTPCT in the last 72 hours. Urine analysis:    Component Value Date/Time   COLORURINE AMBER (A) 03/18/2021 2006   APPEARANCEUR TURBID (A) 03/18/2021 2006   LABSPEC 1.028 03/18/2021 2006   PHURINE 5.0 03/18/2021 2006   GLUCOSEU 150 (A) 03/18/2021 2006   HGBUR NEGATIVE 03/18/2021 2006   BILIRUBINUR NEGATIVE  03/18/2021 2006   KETONESUR 80 (A) 03/18/2021 2006   PROTEINUR 30 (A) 03/18/2021 2006   NITRITE NEGATIVE 03/18/2021 2006   LEUKOCYTESUR NEGATIVE 03/18/2021 2006   Sepsis Labs: Invalid input(s): PROCALCITONIN, LACTICIDVEN  Microbiology: Recent Results (from the past 240 hour(s))  Resp Panel by RT-PCR (Flu A&B, Covid) Nasopharyngeal Swab     Status: None   Collection Time: 03/18/21 12:47 AM   Specimen: Nasopharyngeal Swab; Nasopharyngeal(NP) swabs in vial transport medium  Result Value Ref Range Status   SARS Coronavirus 2 by RT PCR NEGATIVE NEGATIVE Final    Comment: (NOTE) SARS-CoV-2 target nucleic acids are NOT DETECTED.  The SARS-CoV-2 RNA is generally detectable in upper respiratory specimens during the acute phase of infection. The lowest concentration of SARS-CoV-2 viral copies this assay can detect is 138 copies/mL. A negative result does not preclude SARS-Cov-2 infection and should not be used as the sole basis for treatment or other patient management decisions. A  negative result may occur with  improper specimen collection/handling, submission of specimen other than nasopharyngeal swab, presence of viral mutation(s) within the areas targeted by this assay, and inadequate number of viral copies(<138 copies/mL). A negative result must be combined with clinical observations, patient history, and epidemiological information. The expected result is Negative.  Fact Sheet for Patients:  BloggerCourse.com  Fact Sheet for Healthcare Providers:  SeriousBroker.it  This test is no t yet approved or cleared by the [place] FDA and  has been authorized for detection and/or diagnosis of SARS-CoV-2 by FDA under an Emergency Use Authorization (EUA). This EUA will remain  in effect (meaning this test can be used) for the duration of the COVID-19 declaration under Section 564(b)(1) of the Act, 21 U.S.C.section 360bbb-3(b)(1),  unless the authorization is terminated  or revoked sooner.       Influenza A by PCR NEGATIVE NEGATIVE Final   Influenza B by PCR NEGATIVE NEGATIVE Final    Comment: (NOTE) The Xpert Xpress SARS-CoV-2/FLU/RSV plus assay is intended as an aid in the diagnosis of influenza from Nasopharyngeal swab specimens and should not be used as a sole basis for treatment. Nasal washings and aspirates are unacceptable for Xpert Xpress SARS-CoV-2/FLU/RSV testing.  Fact Sheet for Patients: BloggerCourse.com  Fact Sheet for Healthcare Providers: SeriousBroker.it  This test is not yet approved or cleared by the [place] FDA and has been authorized for detection and/or diagnosis of SARS-CoV-2 by FDA under an Emergency Use Authorization (EUA). This EUA will remain in effect (meaning this test can be used) for the duration of the COVID-19 declaration under Section 564(b)(1) of the Act, 21 U.S.C. section 360bbb-3(b)(1), unless the authorization is terminated or revoked.  Performed at [facility], [address], [place], [place] [address]     Radiology Studies: No results found.    Ilyas Lipsitz T. Emberlyn Burlison [facility]  If 7PM-7AM, please contact night-coverage [email or web address] 03/19/2021, 10:43 AM

## 2021-03-19 NOTE — Anesthesia Procedure Notes (Signed)
Procedure Name: Intubation Date/Time: 03/19/2021 4:33 PM Performed by: Raenette Rover, CRNA Pre-anesthesia Checklist: Patient identified, Emergency Drugs available, Patient being monitored and Suction available Patient Re-evaluated:Patient Re-evaluated prior to induction Oxygen Delivery Method: Circle system utilized Preoxygenation: Pre-oxygenation with 100% oxygen Induction Type: IV induction Ventilation: Mask ventilation without difficulty Laryngoscope Size: Mac and 3 Grade View: Grade I Tube type: Oral Tube size: 7.0 mm Number of attempts: 1 Airway Equipment and Method: Stylet Placement Confirmation: ETT inserted through vocal cords under direct vision,  positive ETCO2 and breath sounds checked- equal and bilateral Secured at: 21 cm Tube secured with: Tape Dental Injury: Teeth and Oropharynx as per pre-operative assessment

## 2021-03-20 ENCOUNTER — Telehealth: Payer: Self-pay | Admitting: Hematology and Oncology

## 2021-03-20 ENCOUNTER — Encounter (HOSPITAL_COMMUNITY): Payer: Self-pay | Admitting: General Surgery

## 2021-03-20 DIAGNOSIS — R1011 Right upper quadrant pain: Secondary | ICD-10-CM | POA: Diagnosis not present

## 2021-03-20 DIAGNOSIS — K8050 Calculus of bile duct without cholangitis or cholecystitis without obstruction: Secondary | ICD-10-CM

## 2021-03-20 DIAGNOSIS — Z6835 Body mass index (BMI) 35.0-35.9, adult: Secondary | ICD-10-CM

## 2021-03-20 DIAGNOSIS — K8020 Calculus of gallbladder without cholecystitis without obstruction: Secondary | ICD-10-CM | POA: Diagnosis not present

## 2021-03-20 DIAGNOSIS — F17200 Nicotine dependence, unspecified, uncomplicated: Secondary | ICD-10-CM

## 2021-03-20 DIAGNOSIS — M459 Ankylosing spondylitis of unspecified sites in spine: Secondary | ICD-10-CM | POA: Diagnosis not present

## 2021-03-20 LAB — CBC
HCT: 42.9 % (ref 36.0–46.0)
Hemoglobin: 14 g/dL (ref 12.0–15.0)
MCH: 30 pg (ref 26.0–34.0)
MCHC: 32.6 g/dL (ref 30.0–36.0)
MCV: 92.1 fL (ref 80.0–100.0)
Platelets: 198 10*3/uL (ref 150–400)
RBC: 4.66 MIL/uL (ref 3.87–5.11)
RDW: 13.3 % (ref 11.5–15.5)
WBC: 12.2 10*3/uL — ABNORMAL HIGH (ref 4.0–10.5)
nRBC: 0 % (ref 0.0–0.2)

## 2021-03-20 LAB — GLUCOSE, CAPILLARY
Glucose-Capillary: 147 mg/dL — ABNORMAL HIGH (ref 70–99)
Glucose-Capillary: 122 mg/dL — ABNORMAL HIGH (ref 70–99)

## 2021-03-20 LAB — COMPREHENSIVE METABOLIC PANEL
ALT: 31 U/L (ref 0–44)
AST: 31 U/L (ref 15–41)
Albumin: 3.1 g/dL — ABNORMAL LOW (ref 3.5–5.0)
Alkaline Phosphatase: 52 U/L (ref 38–126)
Anion gap: 5 (ref 5–15)
BUN: 8 mg/dL (ref 6–20)
CO2: 25 mmol/L (ref 22–32)
Calcium: 8.4 mg/dL — ABNORMAL LOW (ref 8.9–10.3)
Chloride: 109 mmol/L (ref 98–111)
Creatinine, Ser: 0.7 mg/dL (ref 0.44–1.00)
GFR, Estimated: >60 mL/min (ref >60)
Glucose, Bld: 116 mg/dL — ABNORMAL HIGH (ref 70–99)
Potassium: 3.9 mmol/L (ref 3.5–5.1)
Sodium: 139 mmol/L (ref 135–145)
Total Bilirubin: 0.7 mg/dL (ref 0.3–1.2)
Total Protein: 5.6 g/dL — ABNORMAL LOW (ref 6.5–8.1)

## 2021-03-20 LAB — URINALYSIS, ROUTINE W REFLEX MICROSCOPIC
Bilirubin Urine: NEGATIVE
Glucose, UA: NEGATIVE mg/dL
Hgb urine dipstick: NEGATIVE
Ketones, ur: 80 mg/dL — AB
Leukocytes,Ua: NEGATIVE
Nitrite: NEGATIVE
Protein, ur: NEGATIVE mg/dL
Specific Gravity, Urine: 1.021 (ref 1.005–1.030)
pH: 5 (ref 5.0–8.0)

## 2021-03-20 LAB — MAGNESIUM: Magnesium: 2 mg/dL (ref 1.7–2.4)

## 2021-03-20 MED ORDER — OXYCODONE HCL 5 MG PO TABS: 5.0000 mg | ORAL_TABLET | Freq: Four times a day (QID) | ORAL | Status: DC | PRN

## 2021-03-20 MED ORDER — ACETAMINOPHEN 500 MG PO TABS
1000.0000 mg | ORAL_TABLET | Freq: Four times a day (QID) | ORAL | Status: DC
  Filled 2021-03-20: qty 2

## 2021-03-20 MED ORDER — IBUPROFEN 200 MG PO TABS: 600.0000 mg | ORAL_TABLET | Freq: Three times a day (TID) | ORAL | 2 refills | Status: AC | PRN

## 2021-03-20 MED ORDER — BETHANECHOL CHLORIDE 10 MG PO TABS: 10.0000 mg | ORAL_TABLET | Freq: Three times a day (TID) | ORAL | 0 refills | Status: DC

## 2021-03-20 MED ORDER — OXYCODONE HCL 5 MG PO TABS: 5.0000 mg | ORAL_TABLET | Freq: Four times a day (QID) | ORAL | 0 refills | Status: DC | PRN

## 2021-03-20 MED ORDER — ACETAMINOPHEN 500 MG PO TABS: 1000.0000 mg | ORAL_TABLET | Freq: Four times a day (QID) | ORAL | 0 refills | Status: AC | PRN

## 2021-03-20 NOTE — Discharge Summary (Signed)
Physician Discharge Summary  Jacqueline Coleman DQQ:[id number] DOB: 11-Oct-1961 DOA: 03/18/2021  PCP: Tally Joe, MD  Admit date: 03/18/2021 Discharge date: 03/20/2021  Admitted From: Home Disposition: Home  Recommendations for Outpatient Follow-up:  1. Follow ups as below. 2. Please obtain CBC/BMP/Mag at follow up 3. Please follow up on the following pending results: None  Home Health: None Equipment/Devices: None  Discharge Condition: Stable CODE STATUS: Full code   Follow-up Information    [facility] Go on 04/09/2021.   Specialty: General Surgery Why: at 9:45 am for post-operative follow up. Please arrive by 9:15 am.  Contact information: [address] [place] [address] [phone]       Tally Joe, MD. Schedule an appointment as soon as possible for a visit in 1 week(s).   Specialty: Family Medicine Contact information: [phone] 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  She had RUQ [place] ordered 4/7 that showed cholelithiasis.  She tried p.o. Phenergan without improvement and directed to ED by GI.  The next day, patient was evaluated by general surgery and had successful laparoscopic cholecystectomy  On the day of discharge, patient felt well.  GI symptoms resolved except for some urinary hesitancy and sense of incomplete void likely from pain medication.  Urinalysis negative for UTI but ketonuria.  Discharged on bethanechol for 4 days.  See individual problem list below for more on hospital course.  Discharge Diagnoses:  Nausea/vomiting/abdominal pain likely biliary colic: RUQ [place] on 4/7 with cholelithiasis. Positive  Murphy sign on exam but no fever or leukocytosis to suggest infection.  She underwent laparoscopic cholecystectomy by Dr. Derrell Lolling on 4/12, and GI symptoms resolved.. -Outpatient follow-up with general surgery and PCP  Uncontrolled IDDM-2 with hyperglycemia: A1c 7.4%.  Uses insulin pump at home.  Has ketonuria but not in DKA Recent Labs  Lab 03/19/21 1606 03/19/21 1735 03/19/21 2143 03/20/21 0217 03/20/21 0741  GLUCAP 104* 155* 198* 147* 122*  -Continue insulin pump at home  Rheumatoid arthritis/ankylosing spondylitis: On Arava and Simponi injection outpatient. -Continue home medications  Tobacco use disorder: Reportedly smokes about 10 cigarettes a day. -Encouraged tobacco cessation  Marijuana use: Reports using CBD almost every other day.  She is aware of cannabinoid related hyperemesis  Morbid obesity-meets criteria with diagnosis of diabetes  Urinary hesitancy/sense of incomplete void: Likely from pain medication.  Urinalysis negative except for ketonuria. -Bethanechol for 4 days -Advised to minimize opioid use for pain    Body mass index is 35.25 kg/m.            Discharge Exam: Vitals:   03/20/21 0159 03/20/21 0457  BP: (!) 125/53 138/69  Pulse: (!) 57 60  Resp: 17 15  Temp: 98.6 F (37 C) 98.4 F (36.9 C)  SpO2: 96% 98%    GENERAL: No apparent distress.  Nontoxic. HEENT: MMM.  Vision and hearing grossly intact.  NECK: Supple.  No apparent JVD.  RESP: On RA.  No IWOB.  Fair aeration bilaterally. CVS:  RRR. Heart sounds normal.  ABD/GI/GU: Bowel sounds present. Soft.  Mild discomfort with palpation MSK/EXT:  Moves extremities. No apparent deformity. No edema.  SKIN: no apparent skin  lesion or wound NEURO: Awake, alert and oriented appropriately.  No apparent focal neuro deficit. PSYCH: Calm. Normal affect.   Discharge Instructions  Discharge Instructions    Diet - low sodium heart healthy   Complete by: As directed    Discharge instructions    Complete by: As directed    It has been a pleasure taking care of you!  You were hospitalized due to nausea, vomiting and abdominal pain likely from your stone.  You were treated surgically and your symptoms improved to the point we think it is safe to let you go home and follow-up with your doctors.  You may try bethanechol next 2 to 3 days to help with emptying your bladder while taking pain medications.  Follow-up with your primary care doctor in 1 to 2 weeks.  See a separate instruction from general surgery.    Take care,   Increase activity slowly   Complete by: As directed      Allergies as of 03/20/2021      Reactions   Methotrexate Other (See Comments)   Put pt in DKA   Adhesive [tape] Other (See Comments)   Blister   Codeine Itching   Farxiga [dapagliflozin]    Lisinopril Other (See Comments)   Back pain   Losartan Other (See Comments)   Back pain   Prednisone Other (See Comments)   Adverse reaction       Medication List    STOP taking these medications   Humira Pen 40 MG/0.4ML Pnkt Generic drug: Adalimumab     TAKE these medications   acetaminophen 500 MG tablet Commonly known as: TYLENOL Take 2 tablets (1,000 mg total) by mouth every 6 (six) hours as needed.   bethanechol 10 MG tablet Commonly known as: URECHOLINE Take 1 tablet (10 mg total) by mouth 3 (three) times daily.   Dexcom G6 Sensor Misc SMARTSIG:1 Each Topical Every 10 Days   Dexcom G6 Transmitter Misc   estradiol 2 MG tablet Commonly known as: ESTRACE Take 2 mg by mouth every other day.   finasteride 5 MG tablet Commonly known as: PROSCAR Take 5 mg by mouth every other day.   ibuprofen 200 MG tablet Commonly known as: Motrin IB Take 3 tablets (600 mg total) by mouth every 8 (eight) hours as needed.   ketoconazole 2 % shampoo Commonly known as: NIZORAL Apply 1 application topically daily.   leflunomide 10 MG tablet Commonly known as: ARAVA Take 20 mg by mouth daily.    minoxidil 2.5 MG tablet Commonly known as: LONITEN Take 0.25 mg by mouth daily.   omeprazole 20 MG capsule Commonly known as: PRILOSEC Take 2 capsules by mouth daily.   oxyCODONE 5 MG immediate release tablet Commonly known as: Oxy IR/ROXICODONE Take 1 tablet (5 mg total) by mouth every 6 (six) hours as needed for moderate pain.   progesterone 100 MG capsule Commonly known as: PROMETRIUM Take 100 mg by mouth at bedtime.   rosuvastatin 5 MG tablet Commonly known as: CRESTOR Take 5 mg by mouth daily.   Simponi Aria 50 MG/4ML Soln injection Generic drug: golimumab Inject 12.5 mg into the vein every 8 (eight) weeks.   spironolactone 100 MG tablet Commonly known as: ALDACTONE Take 100 mg by mouth 2 (two) times daily.   ZyrTEC Allergy 10 MG tablet Generic drug: cetirizine Take 10 mg by mouth daily.       Consultations:  General surgery  Procedures/Studies:  03/19/2021-laparoscopic cholecystectomy   US Abdomen Complete  Result Date: 03/14/2021 CLINICAL DATA:  Chronic upper abdominal pain with nausea and vomiting EXAM: ABDOMEN ULTRASOUND COMPLETE COMPARISON:  None. FINDINGS: Gallbladder: Multiple large cholelithiasis measuring up to 2.4 cm. No pericholecystic fluid or wall thickening visualized. No sonographic Murphy sign noted by sonographer. Common bile duct: Diameter: 5.6 mm Liver: No focal lesion identified. Within normal limits in parenchymal echogenicity. Portal vein is patent on color Doppler imaging with normal direction of blood flow towards the liver. IVC: No abnormality visualized. Pancreas: Visualized portion unremarkable. Spleen: Size and appearance within normal limits. Right Kidney: Length: 12.9 cm. Echogenicity within normal limits. No mass or hydronephrosis visualized. Left Kidney: Length: 12.2 cm. Echogenicity within normal limits. No mass or hydronephrosis visualized. Abdominal aorta: No aneurysm visualized. Other findings: None. IMPRESSION: Cholelithiasis  without secondary signs of acute cholecystitis. Electronically Signed   By: Maudry Mayhew MD   On: 03/14/2021 16:51        The results of significant diagnostics from this hospitalization (including imaging, microbiology, ancillary and laboratory) are listed below for reference.     Microbiology: Recent Results (from the past 240 hour(s))  Resp Panel by RT-PCR (Flu A&B, Covid) Nasopharyngeal Swab     Status: None   Collection Time: 03/18/21 12:47 AM   Specimen: Nasopharyngeal Swab; Nasopharyngeal(NP) swabs in vial transport medium  Result Value Ref Range Status   SARS Coronavirus 2 by RT PCR NEGATIVE NEGATIVE Final    Comment: (NOTE) SARS-CoV-2 target nucleic acids are NOT DETECTED.  The SARS-CoV-2 RNA is generally detectable in upper respiratory specimens during the acute phase of infection. The lowest concentration of SARS-CoV-2 viral copies this assay can detect is 138 copies/mL. A negative result does not preclude SARS-Cov-2 infection and should not be used as the sole basis for treatment or other patient management decisions. A negative result may occur with  improper specimen collection/handling, submission of specimen other than nasopharyngeal swab, presence of viral mutation(s) within the areas targeted by this assay, and inadequate number of viral copies(<138 copies/mL). A negative result must be combined with clinical observations, patient history, and epidemiological information. The expected result is Negative.  Fact Sheet for Patients:  BloggerCourse.com  Fact Sheet for Healthcare Providers:  SeriousBroker.it  This test is no t yet approved or cleared by the [place] FDA and  has been authorized for detection and/or diagnosis of SARS-CoV-2 by FDA under an Emergency Use Authorization (EUA). This EUA will remain  in effect (meaning this test can be used) for the duration of the COVID-19 declaration under  Section 564(b)(1) of the Act, 21 U.S.C.section 360bbb-3(b)(1), unless the authorization is terminated  or revoked sooner.       Influenza A by PCR NEGATIVE NEGATIVE Final   Influenza B by PCR NEGATIVE NEGATIVE Final    Comment: (NOTE) The Xpert Xpress SARS-CoV-2/FLU/RSV plus assay is intended as an aid in the diagnosis of influenza from Nasopharyngeal swab specimens and should not be used as a sole basis for treatment. Nasal washings and aspirates are unacceptable for Xpert Xpress SARS-CoV-2/FLU/RSV testing.  Fact Sheet for Patients: BloggerCourse.com  Fact Sheet for Healthcare Providers: SeriousBroker.it  This test is not yet approved or cleared by the [place] FDA and has been authorized for detection and/or diagnosis of SARS-CoV-2 by FDA under an Emergency Use Authorization (EUA). This EUA will remain in effect (meaning this test can be used) for the duration of the COVID-19 declaration under Section 564(b)(1) of the Act, 21 U.S.C. section 360bbb-3(b)(1), unless the authorization is terminated or  revoked.  Performed at [facility], [address], [place], [place] [address]   MRSA PCR Screening     Status: None   Collection Time: 03/19/21  8:59 AM   Specimen: Nasopharyngeal  Result Value Ref Range Status   MRSA by PCR NEGATIVE NEGATIVE Final    Comment:        The GeneXpert MRSA Assay (FDA approved for NASAL specimens only), is one component of a comprehensive MRSA colonization surveillance program. It is not intended to diagnose MRSA infection nor to guide or monitor treatment for MRSA infections. Performed at [facility], [address], [place], [place] [address]      Labs:  CBC: Recent Labs  Lab 03/18/21 1929 03/19/21 0359 03/20/21 0429  WBC 15.7* 13.8* 12.2*  NEUTROABS 12.6*  --   --   HGB 17.0* 14.3 14.0  HCT 51.7* 44.0 42.9  MCV 89.4 91.1 92.1  PLT  278 222 198   BMP &GFR Recent Labs  Lab 03/18/21 1929 03/19/21 0359 03/20/21 0429  NA 139 137 139  K 3.8 3.7 3.9  CL 105 108 109  CO2 GLUCOSE 187* 116* 116*  BUN CREATININE 0.70 0.52 0.70  CALCIUM 9.5 8.6* 8.4*  MG  --   --  2.0   Estimated Creatinine Clearance: 81.8 mL/min (by C-G formula based on SCr of 0.7 mg/dL). Liver & Pancreas: Recent Labs  Lab 03/18/21 1929 03/19/21 0359 03/20/21 0429  AST 20 14* 31  ALT ALKPHOS 69 57 52  BILITOT 0.9 0.9 0.7  PROT 7.4 6.0* 5.6*  ALBUMIN 4.3 3.4* 3.1*   Recent Labs  Lab 03/18/21 1929  LIPASE 25   No results for input(s): AMMONIA in the last 168 hours. Diabetic: Recent Labs    03/19/21 0359  HGBA1C 7.4*   Recent Labs  Lab 03/19/21 1606 03/19/21 1735 03/19/21 2143 03/20/21 0217 03/20/21 0741  GLUCAP 104* 155* 198* 147* 122*   Cardiac Enzymes: No results for input(s): CKTOTAL, CKMB, CKMBINDEX, TROPONINI in the last 168 hours. No results for input(s): PROBNP in the last 8760 hours. Coagulation Profile: No results for input(s): INR, PROTIME in the last 168 hours. Thyroid Function Tests: No results for input(s): TSH, T4TOTAL, FREET4, T3FREE, THYROIDAB in the last 72 hours. Lipid Profile: No results for input(s): CHOL, HDL, LDLCALC, TRIG, CHOLHDL, LDLDIRECT in the last 72 hours. Anemia Panel: No results for input(s): VITAMINB12, FOLATE, FERRITIN, TIBC, IRON, RETICCTPCT in the last 72 hours. Urine analysis:    Component Value Date/Time   COLORURINE YELLOW 03/20/2021 0838   APPEARANCEUR HAZY (A) 03/20/2021 0838   LABSPEC 1.021 03/20/2021 0838   PHURINE 5.0 03/20/2021 0838   GLUCOSEU NEGATIVE 03/20/2021 0838   HGBUR NEGATIVE 03/20/2021 0838   BILIRUBINUR NEGATIVE 03/20/2021 0838   KETONESUR 80 (A) 03/20/2021 0838   PROTEINUR NEGATIVE 03/20/2021 0838   NITRITE NEGATIVE 03/20/2021 0838   LEUKOCYTESUR NEGATIVE 03/20/2021 0838   Sepsis Labs: Invalid input(s): PROCALCITONIN,  LACTICIDVEN   Time coordinating discharge: 35 minutes  SIGNED:  Almon Hercules, MD  [facility] 03/20/2021, 6:55 PM  If 7PM-7AM, please contact night-coverage [email or web address]

## 2021-03-20 NOTE — Telephone Encounter (Signed)
R/s appts per 4/13 sch msg. Pt aware.  

## 2021-03-20 NOTE — Progress Notes (Signed)
Pt declined medications this morning, said "I dont need them" Discharge information was given to patient and all questions were answered. Patient taken to main entrance via NT.

## 2021-03-20 NOTE — Discharge Instructions (Signed)
[facility] LAPAROSCOPIC SURGERY: POST OP INSTRUCTIONS Always review your discharge instruction sheet given to you by the facility where your surgery was performed. IF YOU HAVE DISABILITY OR FAMILY LEAVE FORMS, YOU MUST BRING THEM TO THE OFFICE FOR PROCESSING.   DO NOT GIVE THEM TO YOUR DOCTOR.  PAIN CONTROL  1. First take acetaminophen (Tylenol) AND/or ibuprofen (Advil) to control your pain after surgery.  Follow directions on package.  Taking acetaminophen (Tylenol) and/or ibuprofen (Advil) regularly after surgery will help to control your pain and lower the amount of prescription pain medication you may need.  You should not take more than 3,000 mg (3 grams) of acetaminophen (Tylenol) in 24 hours.  You should not take ibuprofen (Advil), aleve, motrin, naprosyn or other NSAIDS if you have a history of stomach ulcers or chronic kidney disease.  2. A prescription for pain medication may be given to you upon discharge.  Take your pain medication as prescribed, if you still have uncontrolled pain after taking acetaminophen (Tylenol) or ibuprofen (Advil). 3. Use ice packs to help control pain. 4. If you need a refill on your pain medication, please contact your pharmacy.  They will contact our office to request authorization. Prescriptions will not be filled after 5pm or on week-ends.  HOME MEDICATIONS 5. Take your usually prescribed medications unless otherwise directed.  DIET 6. You should follow a light diet the first few days after arrival home.  Be sure to include lots of fluids daily. Avoid fatty, fried foods.   CONSTIPATION 7. It is common to experience some constipation after surgery and if you are taking pain medication.  Increasing fluid intake and taking a stool softener (such as Colace) will usually help or prevent this problem from occurring.  A mild laxative (Milk of Magnesia or Miralax) should be taken according to package instructions if there are no bowel  movements after 48 hours.  WOUND/INCISION CARE 8. Most patients will experience some swelling and bruising in the area of the incisions.  Ice packs will help.  Swelling and bruising can take several days to resolve.  9. Unless discharge instructions indicate otherwise, follow guidelines below  a. STERI-STRIPS - you may remove your outer bandages 48 hours after surgery, and you may shower at that time.  You have steri-strips (small skin tapes) in place directly over the incision.  These strips should be left on the skin for 7-10 days.   b. DERMABOND/SKIN GLUE - you may shower in 24 hours.  The glue will flake off over the next 2-3 weeks. 10. Any sutures or staples will be removed at the office during your follow-up visit.  ACTIVITIES 11. You may resume regular (light) daily activities beginning the next day--such as daily self-care, walking, climbing stairs--gradually increasing activities as tolerated.  You may have sexual intercourse when it is comfortable.  Refrain from any heavy lifting or straining until approved by your doctor. a. You may drive when you are no longer taking prescription pain medication, you can comfortably wear a seatbelt, and you can safely maneuver your car and apply brakes.  FOLLOW-UP 12. You should see your doctor in the office for a follow-up appointment approximately 2-3 weeks after your surgery.  You should have been given your post-op/follow-up appointment when your surgery was scheduled.  If you did not receive a post-op/follow-up appointment, make sure that you call for this appointment within a day or two after you arrive home to insure a convenient appointment time.     WHEN TO CALL YOUR DOCTOR: 1. Fever over 101.0 2. Inability to urinate 3. Continued bleeding from incision. 4. Increased pain, redness, or drainage from the incision. 5. Increasing abdominal pain  The clinic staff is available to answer your questions during regular business hours.  Please don't  hesitate to call and ask to speak to one of the nurses for clinical concerns.  If you have a medical emergency, go to the nearest emergency room or call 911.  A surgeon from [facility] is always on call at the hospital. [address], [address], [place], [place]  [address] ? [address] [address], [place], [place]   [address] [phone] ? [phone] ? FAX [phone] Web site: [email or web address]  .........   Managing Your Pain After Surgery Without Opioids    Thank you for participating in our program to help patients manage their pain after surgery without opioids. This is part of our effort to provide you with the best care possible, without exposing you or your family to the risk that opioids pose.  What pain can I expect after surgery? You can expect to have some pain after surgery. This is normal. The pain is typically worse the day after surgery, and quickly begins to get better. Many studies have found that many patients are able to manage their pain after surgery with Over-the-Counter (OTC) medications such as Tylenol and Motrin. If you have a condition that does not allow you to take Tylenol or Motrin, notify your surgical team.  How will I manage my pain? The best strategy for controlling your pain after surgery is around the clock pain control with Tylenol (acetaminophen) and Motrin (ibuprofen or Advil). Alternating these medications with each other allows you to maximize your pain control. In addition to Tylenol and Motrin, you can use heating pads or ice packs on your incisions to help reduce your pain.  How will I alternate your regular strength over-the-counter pain medication? You will take a dose of pain medication every three hours. ; Start by taking 650 mg of Tylenol (2 pills of 325 mg) ; 3 hours later take 600 mg of Motrin (3 pills of 200 mg) ; 3 hours after taking the Motrin take 650 mg of Tylenol ; 3 hours after that take 600 mg of  Motrin.   - 1 -  See example - if your first dose of Tylenol is at 12:00 PM   12:00 PM Tylenol 650 mg (2 pills of 325 mg)  3:00 PM Motrin 600 mg (3 pills of 200 mg)  6:00 PM Tylenol 650 mg (2 pills of 325 mg)  9:00 PM Motrin 600 mg (3 pills of 200 mg)  Continue alternating every 3 hours   We recommend that you follow this schedule around-the-clock for at least 3 days after surgery, or until you feel that it is no longer needed. Use the table on the last page of this handout to keep track of the medications you are taking. Important: Do not take more than 3000mg of Tylenol or 3200mg of Motrin in a 24-hour period. Do not take ibuprofen/Motrin if you have a history of bleeding stomach ulcers, severe kidney disease, &/or actively taking a blood thinner  What if I still have pain? If you have pain that is not controlled with the over-the-counter pain medications (Tylenol and Motrin or Advil) you might have what we call "breakthrough" pain. You will receive a prescription for a small amount of an opioid pain medication such as   Oxycodone, Tramadol, or Tylenol with Codeine. Use these opioid pills in the first 24 hours after surgery if you have breakthrough pain. Do not take more than 1 pill every 4-6 hours.  If you still have uncontrolled pain after using all opioid pills, don't hesitate to call our staff using the number provided. We will help make sure you are managing your pain in the best way possible, and if necessary, we can provide a prescription for additional pain medication.   Day 1    Time  Name of Medication Number of pills taken  Amount of Acetaminophen  Pain Level   Comments  AM PM       AM PM       AM PM       AM PM       AM PM       AM PM       AM PM       AM PM       Total Daily amount of Acetaminophen Do not take more than  3,000 mg per day      Day 2    Time  Name of Medication Number of pills taken  Amount of Acetaminophen  Pain Level   Comments  AM  PM       AM PM       AM PM       AM PM       AM PM       AM PM       AM PM       AM PM       Total Daily amount of Acetaminophen Do not take more than  3,000 mg per day      Day 3    Time  Name of Medication Number of pills taken  Amount of Acetaminophen  Pain Level   Comments  AM PM       AM PM       AM PM       AM PM          AM PM       AM PM       AM PM       AM PM       Total Daily amount of Acetaminophen Do not take more than  3,000 mg per day      Day 4    Time  Name of Medication Number of pills taken  Amount of Acetaminophen  Pain Level   Comments  AM PM       AM PM       AM PM       AM PM       AM PM       AM PM       AM PM       AM PM       Total Daily amount of Acetaminophen Do not take more than  3,000 mg per day      Day 5    Time  Name of Medication Number of pills taken  Amount of Acetaminophen  Pain Level   Comments  AM PM       AM PM       AM PM       AM PM       AM PM       AM PM       AM PM         AM PM       Total Daily amount of Acetaminophen Do not take more than  3,000 mg per day       Day 6    Time  Name of Medication Number of pills taken  Amount of Acetaminophen  Pain Level  Comments  AM PM       AM PM       AM PM       AM PM       AM PM       AM PM       AM PM       AM PM       Total Daily amount of Acetaminophen Do not take more than  3,000 mg per day      Day 7    Time  Name of Medication Number of pills taken  Amount of Acetaminophen  Pain Level   Comments  AM PM       AM PM       AM PM       AM PM       AM PM       AM PM       AM PM       AM PM       Total Daily amount of Acetaminophen Do not take more than  3,000 mg per day        For additional information about how and where to safely dispose of unused opioid medications - https://www.[employer]ful[employer].org  Disclaimer: This document contains information and/or instructional materials adapted from [facility]  for the typical patient with your condition. It does not replace medical advice from your health care provider because your experience may differ from that of the typical patient. Talk to your health care provider if you have any questions about this document, your condition or your treatment plan. Adapted from [facility]  

## 2021-03-20 NOTE — Progress Notes (Signed)
1 Day Post-Op  Subjective: Feels well today.  Some soreness, but tolerating liquids.  Doesn't have much of an appetite.  + flatus.  Ambulating.  C/o urinary frequency  ROS: See above, otherwise other systems negative  Objective: Vital signs in last 24 hours: Temp:  [97.6 F (36.4 C)-99 F (37.2 C)] 98.4 F (36.9 C) (04/13 0457) Pulse Rate:  [50-70] 60 (04/13 0457) Resp:  [9-18] 15 (04/13 0457) BP: (118-164)/(51-84) 138/69 (04/13 0457) SpO2:  [91 %-100 %] 98 % (04/13 0457) Last BM Date: 03/18/21  Intake/Output from previous day: 04/12 0701 - 04/13 0700 In: 1530 [P.O.:240; I.V.:1190; IV Piggyback:100] Out: 1010 [Urine:1000; Blood:10] Intake/Output this shift: No intake/output data recorded.  PE: Abd: soft, appropriately tender, +BS, ND, incisions c/d/i  Lab Results:  Recent Labs    03/19/21 0359 03/20/21 0429  WBC 13.8* 12.2*  HGB 14.3 14.0  HCT 44.0 42.9  PLT 222 198   BMET Recent Labs    03/19/21 0359 03/20/21 0429  NA 137 139  K 3.7 3.9  CL 108 109  CO2 22 25  GLUCOSE 116* 116*  BUN 9 8  CREATININE 0.52 0.70  CALCIUM 8.6* 8.4*   PT/INR No results for input(s): LABPROT, INR in the last 72 hours. CMP     Component Value Date/Time   NA 139 03/20/2021 0429   K 3.9 03/20/2021 0429   CL 109 03/20/2021 0429   CO2 25 03/20/2021 0429   GLUCOSE 116 (H) 03/20/2021 0429   BUN 8 03/20/2021 0429   CREATININE 0.70 03/20/2021 0429   CREATININE 0.74 08/15/2020 1001   CALCIUM 8.4 (L) 03/20/2021 0429   PROT 5.6 (L) 03/20/2021 0429   ALBUMIN 3.1 (L) 03/20/2021 0429   AST 31 03/20/2021 0429   AST 14 (L) 08/15/2020 1001   ALT 31 03/20/2021 0429   ALT 21 08/15/2020 1001   ALKPHOS 52 03/20/2021 0429   BILITOT 0.7 03/20/2021 0429   BILITOT 0.3 08/15/2020 1001   GFRNONAA >60 03/20/2021 0429   GFRNONAA >60 08/15/2020 1001   GFRAA >60 08/15/2020 1001   Lipase     Component Value Date/Time   LIPASE 25 03/18/2021 1929       Studies/Results: No results  found.  Anti-infectives: Anti-infectives (From admission, onward)   Start     Dose/Rate Route Frequency Ordered Stop   03/19/21 1630  ceFAZolin (ANCEF) IVPB 2g/100 mL premix        2 g 200 mL/hr over 30 Minutes Intravenous  Once 03/19/21 1616 03/19/21 1705   03/19/21 1613  ceFAZolin (ANCEF) 2-4 GM/100ML-% IVPB       Note to Pharmacy: Sharyn Creamer   : cabinet override      03/19/21 1613 03/19/21 1656       Assessment/Plan POD 1, s/p lap chole by Dr. Derrell Lolling -tolerating liquids, adv to regular diet, but doesn't want much solid food.  This is fine and informed her she can advance her diet as she tolerates as some people don't have a great appetite post op.  She is drinking well -pain well controlled -follow up being arranged -DC instructions given to patient as well as her pain med -UA as she would like to rule out UTI prior to DC, D/W primary service  FEN - regular diet VTE - ok for lovenox from our standpoint ID - pre-op, none post op   LOS: 1 day    Letha Cape , [facility] 03/20/2021, 8:38 AM Please see Amion  for pager number during day hours 7:00am-4:30pm or 7:00am -11:30am on weekends

## 2021-03-20 NOTE — Plan of Care (Signed)
  Problem: Education: Goal: Knowledge of General Education information will improve Description: Including pain rating scale, medication(s)/side effects and non-pharmacologic comfort measures Outcome: Adequate for Discharge   Problem: Health Behavior/Discharge Planning: Goal: Ability to manage health-related needs will improve Outcome: Adequate for Discharge   Problem: Clinical Measurements: Goal: Ability to maintain clinical measurements within normal limits will improve Outcome: Adequate for Discharge Goal: Will remain free from infection Outcome: Adequate for Discharge Goal: Diagnostic test results will improve Outcome: Adequate for Discharge Goal: Respiratory complications will improve Outcome: Adequate for Discharge Goal: Cardiovascular complication will be avoided Outcome: Adequate for Discharge   Problem: Pain Managment: Goal: General experience of comfort will improve Outcome: Adequate for Discharge

## 2021-03-21 ENCOUNTER — Inpatient Hospital Stay: Payer: BC Managed Care – PPO | Admitting: Hematology and Oncology

## 2021-03-21 ENCOUNTER — Inpatient Hospital Stay: Payer: BC Managed Care – PPO

## 2021-03-21 LAB — SURGICAL PATHOLOGY

## 2021-03-29 ENCOUNTER — Ambulatory Visit
Admission: RE | Admit: 2021-03-29 | Discharge: 2021-03-29 | Disposition: A | Discharge status: Home / Self Care | Payer: BC Managed Care – PPO | Source: Ambulatory Visit | Attending: Rheumatology | Admitting: Rheumatology

## 2021-03-29 ENCOUNTER — Other Ambulatory Visit: Payer: Self-pay

## 2021-03-29 DIAGNOSIS — M533 Sacrococcygeal disorders, not elsewhere classified: Secondary | ICD-10-CM | POA: Diagnosis not present

## 2021-03-29 DIAGNOSIS — M47816 Spondylosis without myelopathy or radiculopathy, lumbar region: Secondary | ICD-10-CM | POA: Diagnosis not present

## 2021-03-29 DIAGNOSIS — K6389 Other specified diseases of intestine: Secondary | ICD-10-CM | POA: Diagnosis not present

## 2021-03-29 DIAGNOSIS — M47819 Spondylosis without myelopathy or radiculopathy, site unspecified: Secondary | ICD-10-CM

## 2021-03-29 DIAGNOSIS — M4310 Spondylolisthesis, site unspecified: Secondary | ICD-10-CM | POA: Diagnosis not present

## 2021-03-29 MED ORDER — GADOBENATE DIMEGLUMINE 529 MG/ML IV SOLN
18.0000 mL | Freq: Once | INTRAVENOUS | Status: AC | PRN
  Administered 2021-03-29: 18 mL via INTRAVENOUS

## 2021-03-30 ENCOUNTER — Other Ambulatory Visit: Payer: BC Managed Care – PPO

## 2021-04-01 ENCOUNTER — Encounter (HOSPITAL_COMMUNITY): Payer: Self-pay

## 2021-04-01 ENCOUNTER — Ambulatory Visit (HOSPITAL_COMMUNITY): Payer: BC Managed Care – PPO

## 2021-04-05 DIAGNOSIS — Z86010 Personal history of colonic polyps: Secondary | ICD-10-CM | POA: Diagnosis not present

## 2021-04-05 DIAGNOSIS — K8020 Calculus of gallbladder without cholecystitis without obstruction: Secondary | ICD-10-CM | POA: Diagnosis not present

## 2021-04-05 DIAGNOSIS — Z9049 Acquired absence of other specified parts of digestive tract: Secondary | ICD-10-CM | POA: Diagnosis not present

## 2021-04-11 ENCOUNTER — Encounter: Payer: Self-pay | Admitting: Hematology and Oncology

## 2021-04-11 ENCOUNTER — Inpatient Hospital Stay: Payer: BC Managed Care – PPO | Attending: Hematology and Oncology | Admitting: Hematology and Oncology

## 2021-04-11 ENCOUNTER — Other Ambulatory Visit: Payer: Self-pay | Admitting: Hematology and Oncology

## 2021-04-11 ENCOUNTER — Other Ambulatory Visit: Payer: Self-pay

## 2021-04-11 ENCOUNTER — Inpatient Hospital Stay: Payer: BC Managed Care – PPO

## 2021-04-11 VITALS — BP 136/76 | HR 65 | Temp 97.2°F | Resp 19 | Ht 63.0 in | Wt 199.9 lb

## 2021-04-11 DIAGNOSIS — M810 Age-related osteoporosis without current pathological fracture: Secondary | ICD-10-CM | POA: Insufficient documentation

## 2021-04-11 DIAGNOSIS — F17210 Nicotine dependence, cigarettes, uncomplicated: Secondary | ICD-10-CM | POA: Diagnosis not present

## 2021-04-11 DIAGNOSIS — M069 Rheumatoid arthritis, unspecified: Secondary | ICD-10-CM | POA: Diagnosis not present

## 2021-04-11 DIAGNOSIS — K8020 Calculus of gallbladder without cholecystitis without obstruction: Secondary | ICD-10-CM | POA: Diagnosis not present

## 2021-04-11 DIAGNOSIS — D751 Secondary polycythemia: Secondary | ICD-10-CM | POA: Insufficient documentation

## 2021-04-11 DIAGNOSIS — F17200 Nicotine dependence, unspecified, uncomplicated: Secondary | ICD-10-CM

## 2021-04-11 DIAGNOSIS — M459 Ankylosing spondylitis of unspecified sites in spine: Secondary | ICD-10-CM | POA: Insufficient documentation

## 2021-04-11 DIAGNOSIS — D72829 Elevated white blood cell count, unspecified: Secondary | ICD-10-CM | POA: Insufficient documentation

## 2021-04-11 DIAGNOSIS — Z79899 Other long term (current) drug therapy: Secondary | ICD-10-CM | POA: Insufficient documentation

## 2021-04-11 DIAGNOSIS — E119 Type 2 diabetes mellitus without complications: Secondary | ICD-10-CM | POA: Insufficient documentation

## 2021-04-11 LAB — CBC WITH DIFFERENTIAL (CANCER CENTER ONLY)
Abs Immature Granulocytes: 0.04 10*3/uL (ref 0.00–0.07)
Basophils Absolute: 0.1 10*3/uL (ref 0.0–0.1)
Basophils Relative: 1 %
Eosinophils Absolute: 0.2 10*3/uL (ref 0.0–0.5)
Eosinophils Relative: 3 %
HCT: 47 % — ABNORMAL HIGH (ref 36.0–46.0)
Hemoglobin: 15.6 g/dL — ABNORMAL HIGH (ref 12.0–15.0)
Immature Granulocytes: 1 %
Lymphocytes Relative: 29 %
Lymphs Abs: 2.4 10*3/uL (ref 0.7–4.0)
MCH: 29.7 pg (ref 26.0–34.0)
MCHC: 33.2 g/dL (ref 30.0–36.0)
MCV: 89.5 fL (ref 80.0–100.0)
Monocytes Absolute: 0.7 10*3/uL (ref 0.1–1.0)
Monocytes Relative: 9 %
Neutro Abs: 4.8 10*3/uL (ref 1.7–7.7)
Neutrophils Relative %: 57 %
Platelet Count: 259 10*3/uL (ref 150–400)
RBC: 5.25 MIL/uL — ABNORMAL HIGH (ref 3.87–5.11)
RDW: 13.1 % (ref 11.5–15.5)
WBC Count: 8.2 10*3/uL (ref 4.0–10.5)
nRBC: 0 % (ref 0.0–0.2)

## 2021-04-11 LAB — CMP (CANCER CENTER ONLY)
ALT: 25 U/L (ref 0–44)
AST: 20 U/L (ref 15–41)
Albumin: 3.6 g/dL (ref 3.5–5.0)
Alkaline Phosphatase: 77 U/L (ref 38–126)
Anion gap: 8 (ref 5–15)
BUN: 8 mg/dL (ref 6–20)
CO2: 23 mmol/L (ref 22–32)
Calcium: 9.3 mg/dL (ref 8.9–10.3)
Chloride: 108 mmol/L (ref 98–111)
Creatinine: 0.7 mg/dL (ref 0.44–1.00)
GFR, Estimated: >60 mL/min (ref >60)
Glucose, Bld: 150 mg/dL — ABNORMAL HIGH (ref 70–99)
Potassium: 4.3 mmol/L (ref 3.5–5.1)
Sodium: 139 mmol/L (ref 135–145)
Total Bilirubin: 0.4 mg/dL (ref 0.3–1.2)
Total Protein: 6.9 g/dL (ref 6.5–8.1)

## 2021-04-11 NOTE — Progress Notes (Signed)
[facility] [phone]   [phone]  PROGRESS NOTE  Patient Care Team: Tally Joe, MD as PCP - General (Family Medicine)  Hematological/Oncological History # Secondary Polycythemia # Leukocytosis 1)  08/19/2019: WBC 15.6, Hgb 19.2, Hct 55.7, Plt 355 2) 07/17/2020: WBC 11.4, Hgb 16.4, Hct 48.2, Plt 250 3) 08/15/2020: establish care with Dr. Leonides Schanz   Interval History:  Jacqueline Coleman 60 y.o. female with medical history significant for secondary polycythemia who presents for a follow up visit. The patient's last visit was on 08/15/2020. In the interim since the last visit she underwent a removal of her gallbladder on 03/19/2021.  On exam today Jacqueline Coleman reports she has felt much better since she had her gallbladder removed.  She noted that her episodes of vomiting did not have a clear etiology and now they are resolved.  She notes that she is still little bit "food shot" and that she is afraid of certain foods that caused her upset when her gallbladder was malfunctioning.  She has been doing her best to stop smoking and is currently smoking only about 3 cigarettes/day and 1/2 cigarette at a time.  Unfortunately she has not had a sleep study performed that we are hoping for.  She otherwise denies any fevers, chills, sweats, nausea, vomiting or diarrhea.  A full 10 point ROS is listed below.  MEDICAL HISTORY:  Past Medical History:  Diagnosis Date  . Ankylosing spondylitis (HCC)   . Diabetes mellitus without complication (HCC)   . Osteoporosis   . Rheumatoid arthritis (HCC)     SURGICAL HISTORY: Past Surgical History:  Procedure Laterality Date  . CARPAL TUNNEL RELEASE Right 2003  . CESAREAN SECTION    . CHOLECYSTECTOMY N/A 03/19/2021   Procedure: LAPAROSCOPIC CHOLECYSTECTOMY;  Surgeon: Axel Filler, MD;  Location: [facility];  Service: General;  Laterality: N/A;    SOCIAL HISTORY: Social History   Socioeconomic History  . Marital  status: Married    Spouse name: Not on file  . Number of children: Not on file  . Years of education: Not on file  . Highest education level: Not on file  Occupational History  . Occupation: retired  Tobacco Use  . Smoking status: Current Every Day Smoker    Packs/day: 0.15    Years: 30.00    Pack years: 4.50    Types: Cigarettes  . Smokeless tobacco: Never Used  Vaping Use  . Vaping Use: Never used  Substance and Sexual Activity  . Alcohol use: Not Currently  . Drug use: Yes    Types: Marijuana  . Sexual activity: Never  Other Topics Concern  . Not on file  Social History Narrative  . Not on file   Social Determinants of Health   Financial Resource Strain: Not on file  Food Insecurity: Not on file  Transportation Needs: Not on file  Physical Activity: Not on file  Stress: Not on file  Social Connections: Not on file  Intimate Partner Violence: Not on file    FAMILY HISTORY: Family History  Problem Relation Age of Onset  . Diabetes Mother   . Diabetes Father   . Breast cancer Neg Hx     ALLERGIES:  is allergic to methotrexate, adhesive [tape], codeine, farxiga [dapagliflozin], hydrocodone, lisinopril, losartan, and prednisone.  MEDICATIONS:  Current Outpatient Medications  Medication Sig Dispense Refill  . acetaminophen (TYLENOL) 500 MG tablet Take 2 tablets (1,000 mg total) by mouth every 6 (six) hours as needed. 30 tablet 0  .  cetirizine (ZYRTEC ALLERGY) 10 MG tablet Take 10 mg by mouth daily.    . Continuous Blood Gluc Sensor (DEXCOM G6 SENSOR) MISC SMARTSIG:1 Each Topical Every 10 Days    . Continuous Blood Gluc Transmit (DEXCOM G6 TRANSMITTER) MISC     . estradiol (ESTRACE) 2 MG tablet Take 2 mg by mouth every other day.    . finasteride (PROSCAR) 5 MG tablet Take 5 mg by mouth every other day.    Marland Kitchen golimumab (SIMPONI ARIA) 50 MG/4ML SOLN injection Inject 12.5 mg into the vein every 8 (eight) weeks.    Marland Kitchen ibuprofen (MOTRIN IB) 200 MG tablet Take 3 tablets  (600 mg total) by mouth every 8 (eight) hours as needed. 100 tablet 2  . insulin lispro (HUMALOG) 100 UNIT/ML injection Humalog U-100 Insulin 100 unit/mL subcutaneous solution    . ketoconazole (NIZORAL) 2 % shampoo Apply 1 application topically daily.    Marland Kitchen leflunomide (ARAVA) 10 MG tablet Take 20 mg by mouth daily.    . minoxidil (LONITEN) 2.5 MG tablet Take 0.25 mg by mouth daily.    Marland Kitchen omeprazole (PRILOSEC) 20 MG capsule Take 2 capsules by mouth daily.    . progesterone (PROMETRIUM) 100 MG capsule Take 100 mg by mouth at bedtime.    . rosuvastatin (CRESTOR) 5 MG tablet Take 5 mg by mouth daily.    Marland Kitchen spironolactone (ALDACTONE) 100 MG tablet Take 100 mg by mouth 2 (two) times daily.     No current facility-administered medications for this visit.    REVIEW OF SYSTEMS:   Constitutional: ( - ) fevers, ( - )  chills , ( - ) night sweats Eyes: ( - ) blurriness of vision, ( - ) double vision, ( - ) watery eyes Ears, nose, mouth, throat, and face: ( - ) mucositis, ( - ) sore throat Respiratory: ( - ) cough, ( - ) dyspnea, ( - ) wheezes Cardiovascular: ( - ) palpitation, ( - ) chest discomfort, ( - ) lower extremity swelling Gastrointestinal:  ( - ) nausea, ( - ) heartburn, ( - ) change in bowel habits Skin: ( - ) abnormal skin rashes Lymphatics: ( - ) new lymphadenopathy, ( - ) easy bruising Neurological: ( - ) numbness, ( - ) tingling, ( - ) new weaknesses Behavioral/Psych: ( - ) mood change, ( - ) new changes  All other systems were reviewed with the patient and are negative.  PHYSICAL EXAMINATION:  Vitals:   04/11/21 1008  BP: 136/76  Pulse: 65  Resp: 19  Temp: (!) 97.2 F (36.2 C)  SpO2: 99%   Filed Weights   04/11/21 1008  Weight: 199 lb 14.4 oz (90.7 kg)    GENERAL: well appearing middle aged Caucasian female.  alert, no distress and comfortable SKIN: skin color, texture, turgor are normal, no rashes or significant lesions EYES: conjunctiva are pink and non-injected,  sclera clear LUNGS: clear to auscultation and percussion with normal breathing effort HEART: regular rate & rhythm and no murmurs and no lower extremity edema Musculoskeletal: no cyanosis of digits and no clubbing  PSYCH: alert & oriented x 3, fluent speech NEURO: no focal motor/sensory deficits  LABORATORY DATA:  I have reviewed the data as listed CBC Latest Ref Rng & Units 04/11/2021 03/20/2021 03/19/2021  WBC 4.0 - 10.5 K/uL 8.2 12.2(H) 13.8(H)  Hemoglobin 12.0 - 15.0 g/dL 15.6(H) 14.0 14.3  Hematocrit 36.0 - 46.0 % 47.0(H) 42.9 44.0  Platelets 150 - 400 K/uL 259 198 222  CMP Latest Ref Rng & Units 04/11/2021 03/20/2021 03/19/2021  Glucose 70 - 99 mg/dL 161(W) 960(A) 540(J)  BUN 6 - 20 mg/dL Creatinine 0.44 - 1.00 mg/dL 8.11 9.14 7.82  Sodium 135 - 145 mmol/L 139 139 137  Potassium 3.5 - 5.1 mmol/L 4.3 3.9 3.7  Chloride 98 - 111 mmol/L 108 109 108  CO2 22 - 32 mmol/L Calcium 8.9 - 10.3 mg/dL 9.3 9.5(A) 2.1(H)  Total Protein 6.5 - 8.1 g/dL 6.9 0.8(M) 6.0(L)  Total Bilirubin 0.3 - 1.2 mg/dL 0.4 0.7 0.9  Alkaline Phos 38 - 126 U/L 77 52 57  AST 15 - 41 U/L 20 31 14(L)  ALT 0 - 44 U/L RADIOGRAPHIC STUDIES: US Abdomen Complete  Result Date: 03/14/2021 CLINICAL DATA:  Chronic upper abdominal pain with nausea and vomiting EXAM: ABDOMEN ULTRASOUND COMPLETE COMPARISON:  None. FINDINGS: Gallbladder: Multiple large cholelithiasis measuring up to 2.4 cm. No pericholecystic fluid or wall thickening visualized. No sonographic Murphy sign noted by sonographer. Common bile duct: Diameter: 5.6 mm Liver: No focal lesion identified. Within normal limits in parenchymal echogenicity. Portal vein is patent on color Doppler imaging with normal direction of blood flow towards the liver. IVC: No abnormality visualized. Pancreas: Visualized portion unremarkable. Spleen: Size and appearance within normal limits. Right Kidney: Length: 12.9 cm. Echogenicity within normal limits. No  mass or hydronephrosis visualized. Left Kidney: Length: 12.2 cm. Echogenicity within normal limits. No mass or hydronephrosis visualized. Abdominal aorta: No aneurysm visualized. Other findings: None. IMPRESSION: Cholelithiasis without secondary signs of acute cholecystitis. Electronically Signed   By: Maudry Mayhew MD   On: 03/14/2021 16:51   MR SACRUM SI JOINTS W WO CONTRAST  Result Date: 03/29/2021 CLINICAL DATA:  Low back and SI joint pain for 2 years. No acute injury or prior relevant surgery. EXAM: MRI SACRUM WITHOUT CONTRAST TECHNIQUE: Multiplanar multi-sequence MR imaging of the sacrum was performed. No intravenous contrast was administered. COMPARISON:  Lumbar MRI 11/19/2019. Report only from pelvic CT 07/15/2002 and outside radiographs 08/01/2019-unavailable. FINDINGS: Bones/Joint/Cartilage The sacrum and sacroiliac joints appear normal. There are no erosive changes or abnormal synovial enhancement. There are prominent facet degenerative changes in the lower lumbar spine with associated synovial enhancement, especially at L4-5. The grade 1 anterolisthesis and mild left-sided lateral recess and foraminal narrowing at that level appears unchanged from previous MRI. Ligaments Not relevant for exam/indication. Muscles and Tendons Unremarkable. Soft tissues Possible mild rectosigmoid colonic wall thickening with mucosal hyperenhancement. Probable postsurgical changes anteriorly in the lower uterine segment with a cervical ring. IMPRESSION: 1. The sacroiliac joints appear normal. 2. Prominent lumbar facet arthropathy with synovial enhancement, especially at L4-5. This may contribute to low back pain. No nerve root encroachment identified. 3. Rectosigmoid colon wall thickening and mucosal hyperenhancement suggesting chronic colitis. Correlate clinically. Electronically Signed   By: Carey Bullocks M.D.   On: 03/29/2021 13:09    ASSESSMENT & PLAN Jacqueline Coleman 60 y.o. female with medical history  significant for secondary polycythemia who presents for a follow up visit.  After review the labs, review the records, discussion with the patient the findings are most consistent with a secondary polycythemia and leukocytosis secondary to smoking and possible obstructive sleep apnea.  She has no signs or symptoms of would be concerning for a myeloproliferative neoplasm at this time.   #Polycythemia, Likely Secondary # Leukocytosis --findings are most consistent with a secondary polycythemia from smoking and OSA.  --  recommend smoking cessation. Will re-refer to sleep medicine for consideration of a sleep study --JAK2 w/ reflex and BCR/ABL both returned negative --RTC in 6 months to reassess after sleep evaluation.    Orders Placed This Encounter  Procedures  . Polysomnography 4 or more parameters    Secondary polycythemia and symptoms concerning for OSA    Standing Status:   Future    Standing Expiration Date:   04/11/2022    Order Specific Question:   Where should this test be performed:    Answer:   [facility]    All questions were answered. The patient knows to call the clinic with any problems, questions or concerns.  A total of more than 30 minutes were spent on this encounter and over half of that time was spent on counseling and coordination of care as outlined above.   Ulysees Barns, MD Department of Hematology/Oncology [facility] Phone: [phone] Pager: [phone] Email: [email or web address]  04/11/2021 1:10 PM

## 2021-04-12 ENCOUNTER — Telehealth: Payer: Self-pay | Admitting: Hematology and Oncology

## 2021-04-12 NOTE — Telephone Encounter (Signed)
Scheduled per los. Called and spoke with patient. Confirmed appts  

## 2021-04-17 DIAGNOSIS — M0579 Rheumatoid arthritis with rheumatoid factor of multiple sites without organ or systems involvement: Secondary | ICD-10-CM | POA: Diagnosis not present

## 2021-04-19 ENCOUNTER — Observation Stay (HOSPITAL_COMMUNITY)
Admission: EM | Admit: 2021-04-19 | Discharge: 2021-04-21 | Disposition: A | Discharge status: Home / Self Care | Payer: BC Managed Care – PPO | Attending: Internal Medicine | Admitting: Internal Medicine

## 2021-04-19 ENCOUNTER — Emergency Department (HOSPITAL_COMMUNITY): Payer: BC Managed Care – PPO

## 2021-04-19 ENCOUNTER — Encounter (HOSPITAL_COMMUNITY): Payer: Self-pay

## 2021-04-19 DIAGNOSIS — Z20822 Contact with and (suspected) exposure to covid-19: Secondary | ICD-10-CM | POA: Insufficient documentation

## 2021-04-19 DIAGNOSIS — E785 Hyperlipidemia, unspecified: Secondary | ICD-10-CM | POA: Diagnosis not present

## 2021-04-19 DIAGNOSIS — E119 Type 2 diabetes mellitus without complications: Secondary | ICD-10-CM | POA: Diagnosis not present

## 2021-04-19 DIAGNOSIS — L03211 Cellulitis of face: Secondary | ICD-10-CM | POA: Diagnosis not present

## 2021-04-19 DIAGNOSIS — I959 Hypotension, unspecified: Secondary | ICD-10-CM | POA: Diagnosis not present

## 2021-04-19 DIAGNOSIS — K469 Unspecified abdominal hernia without obstruction or gangrene: Secondary | ICD-10-CM | POA: Diagnosis not present

## 2021-04-19 DIAGNOSIS — K8689 Other specified diseases of pancreas: Secondary | ICD-10-CM | POA: Diagnosis not present

## 2021-04-19 DIAGNOSIS — J340 Abscess, furuncle and carbuncle of nose: Secondary | ICD-10-CM | POA: Insufficient documentation

## 2021-04-19 DIAGNOSIS — F17210 Nicotine dependence, cigarettes, uncomplicated: Secondary | ICD-10-CM | POA: Insufficient documentation

## 2021-04-19 DIAGNOSIS — K449 Diaphragmatic hernia without obstruction or gangrene: Secondary | ICD-10-CM | POA: Diagnosis not present

## 2021-04-19 DIAGNOSIS — R112 Nausea with vomiting, unspecified: Secondary | ICD-10-CM | POA: Diagnosis not present

## 2021-04-19 DIAGNOSIS — R1033 Periumbilical pain: Secondary | ICD-10-CM

## 2021-04-19 DIAGNOSIS — R519 Headache, unspecified: Secondary | ICD-10-CM | POA: Diagnosis not present

## 2021-04-19 DIAGNOSIS — K3184 Gastroparesis: Secondary | ICD-10-CM | POA: Diagnosis not present

## 2021-04-19 DIAGNOSIS — K56609 Unspecified intestinal obstruction, unspecified as to partial versus complete obstruction: Secondary | ICD-10-CM

## 2021-04-19 DIAGNOSIS — R030 Elevated blood-pressure reading, without diagnosis of hypertension: Secondary | ICD-10-CM | POA: Diagnosis not present

## 2021-04-19 DIAGNOSIS — R231 Pallor: Secondary | ICD-10-CM | POA: Diagnosis not present

## 2021-04-19 DIAGNOSIS — K458 Other specified abdominal hernia without obstruction or gangrene: Secondary | ICD-10-CM

## 2021-04-19 DIAGNOSIS — Z794 Long term (current) use of insulin: Secondary | ICD-10-CM

## 2021-04-19 DIAGNOSIS — R109 Unspecified abdominal pain: Secondary | ICD-10-CM | POA: Diagnosis present

## 2021-04-19 DIAGNOSIS — K3189 Other diseases of stomach and duodenum: Secondary | ICD-10-CM | POA: Diagnosis not present

## 2021-04-19 DIAGNOSIS — M47814 Spondylosis without myelopathy or radiculopathy, thoracic region: Secondary | ICD-10-CM | POA: Diagnosis not present

## 2021-04-19 DIAGNOSIS — D72829 Elevated white blood cell count, unspecified: Secondary | ICD-10-CM | POA: Diagnosis not present

## 2021-04-19 DIAGNOSIS — R0902 Hypoxemia: Secondary | ICD-10-CM | POA: Diagnosis not present

## 2021-04-19 DIAGNOSIS — E1169 Type 2 diabetes mellitus with other specified complication: Secondary | ICD-10-CM

## 2021-04-19 DIAGNOSIS — L0390 Cellulitis, unspecified: Secondary | ICD-10-CM

## 2021-04-19 DIAGNOSIS — E1165 Type 2 diabetes mellitus with hyperglycemia: Secondary | ICD-10-CM | POA: Diagnosis not present

## 2021-04-19 LAB — COMPREHENSIVE METABOLIC PANEL
ALT: 26 U/L (ref 0–44)
AST: 23 U/L (ref 15–41)
Albumin: 4.3 g/dL (ref 3.5–5.0)
Alkaline Phosphatase: 77 U/L (ref 38–126)
Anion gap: 12 (ref 5–15)
BUN: 11 mg/dL (ref 6–20)
CO2: 20 mmol/L — ABNORMAL LOW (ref 22–32)
Calcium: 9.8 mg/dL (ref 8.9–10.3)
Chloride: 106 mmol/L (ref 98–111)
Creatinine, Ser: 0.64 mg/dL (ref 0.44–1.00)
GFR, Estimated: >60 mL/min (ref >60)
Glucose, Bld: 275 mg/dL — ABNORMAL HIGH (ref 70–99)
Potassium: 4.5 mmol/L (ref 3.5–5.1)
Sodium: 138 mmol/L (ref 135–145)
Total Bilirubin: 0.2 mg/dL — ABNORMAL LOW (ref 0.3–1.2)
Total Protein: 7.8 g/dL (ref 6.5–8.1)

## 2021-04-19 LAB — URINALYSIS, ROUTINE W REFLEX MICROSCOPIC
Bacteria, UA: NONE SEEN
Bilirubin Urine: NEGATIVE
Glucose, UA: >=500 mg/dL — AB
Hgb urine dipstick: NEGATIVE
Ketones, ur: 80 mg/dL — AB
Leukocytes,Ua: NEGATIVE
Nitrite: NEGATIVE
Protein, ur: NEGATIVE mg/dL
Specific Gravity, Urine: 1.029 (ref 1.005–1.030)
pH: 5 (ref 5.0–8.0)

## 2021-04-19 LAB — RESP PANEL BY RT-PCR (FLU A&B, COVID) ARPGX2
Influenza A by PCR: NEGATIVE
Influenza B by PCR: NEGATIVE
SARS Coronavirus 2 by RT PCR: NEGATIVE

## 2021-04-19 LAB — CBC WITH DIFFERENTIAL/PLATELET
Abs Immature Granulocytes: 0.15 10*3/uL — ABNORMAL HIGH (ref 0.00–0.07)
Basophils Absolute: 0.1 10*3/uL (ref 0.0–0.1)
Basophils Relative: 0 %
Eosinophils Absolute: 0 10*3/uL (ref 0.0–0.5)
Eosinophils Relative: 0 %
HCT: 51.5 % — ABNORMAL HIGH (ref 36.0–46.0)
Hemoglobin: 16.7 g/dL — ABNORMAL HIGH (ref 12.0–15.0)
Immature Granulocytes: 1 %
Lymphocytes Relative: 8 %
Lymphs Abs: 1.4 10*3/uL (ref 0.7–4.0)
MCH: 29.5 pg (ref 26.0–34.0)
MCHC: 32.4 g/dL (ref 30.0–36.0)
MCV: 90.8 fL (ref 80.0–100.0)
Monocytes Absolute: 0.8 10*3/uL (ref 0.1–1.0)
Monocytes Relative: 4 %
Neutro Abs: 15 10*3/uL — ABNORMAL HIGH (ref 1.7–7.7)
Neutrophils Relative %: 87 %
Platelets: 256 10*3/uL (ref 150–400)
RBC: 5.67 MIL/uL — ABNORMAL HIGH (ref 3.87–5.11)
RDW: 13.2 % (ref 11.5–15.5)
WBC: 17.3 10*3/uL — ABNORMAL HIGH (ref 4.0–10.5)
nRBC: 0 % (ref 0.0–0.2)

## 2021-04-19 LAB — LACTIC ACID, PLASMA
Lactic Acid, Venous: 2.4 mmol/L (ref 0.5–1.9)
Lactic Acid, Venous: 1.8 mmol/L (ref 0.5–1.9)

## 2021-04-19 LAB — LIPASE, BLOOD: Lipase: 26 U/L (ref 11–51)

## 2021-04-19 LAB — CBG MONITORING, ED: Glucose-Capillary: 239 mg/dL — ABNORMAL HIGH (ref 70–99)

## 2021-04-19 LAB — GLUCOSE, CAPILLARY: Glucose-Capillary: 269 mg/dL — ABNORMAL HIGH (ref 70–99)

## 2021-04-19 MED ORDER — INSULIN ASPART 100 UNIT/ML IJ SOLN
0.0000 [IU] | INTRAMUSCULAR | Status: DC
  Administered 2021-04-19: 8 [IU] via SUBCUTANEOUS
  Administered 2021-04-20: 3 [IU] via SUBCUTANEOUS
  Administered 2021-04-20: 2 [IU] via SUBCUTANEOUS
  Administered 2021-04-20 (×2): 3 [IU] via SUBCUTANEOUS
  Administered 2021-04-21: 2 [IU] via SUBCUTANEOUS
  Administered 2021-04-21: 3 [IU] via SUBCUTANEOUS
  Administered 2021-04-21 (×2): 2 [IU] via SUBCUTANEOUS

## 2021-04-19 MED ORDER — ESTRADIOL 1 MG PO TABS
2.0000 mg | ORAL_TABLET | ORAL | Status: DC
  Administered 2021-04-20: 2 mg via ORAL
  Filled 2021-04-19: qty 2

## 2021-04-19 MED ORDER — SODIUM CHLORIDE 0.9 % IV BOLUS
1000.0000 mL | Freq: Once | INTRAVENOUS | Status: AC
  Administered 2021-04-19: 1000 mL via INTRAVENOUS

## 2021-04-19 MED ORDER — SODIUM CHLORIDE 0.9 % IV SOLN
12.5000 mg | Freq: Four times a day (QID) | INTRAVENOUS | Status: DC | PRN
  Administered 2021-04-19 – 2021-04-21 (×4): 12.5 mg via INTRAVENOUS
  Filled 2021-04-19: qty 12.5
  Filled 2021-04-19: qty 0.5
  Filled 2021-04-19 (×3): qty 12.5

## 2021-04-19 MED ORDER — ALUM & MAG HYDROXIDE-SIMETH 200-200-20 MG/5ML PO SUSP
30.0000 mL | ORAL | Status: DC | PRN
  Administered 2021-04-20 (×2): 30 mL via ORAL
  Filled 2021-04-19 (×2): qty 30

## 2021-04-19 MED ORDER — MINOXIDIL 2.5 MG PO TABS
2.5000 mg | ORAL_TABLET | Freq: Every day | ORAL | Status: DC
  Administered 2021-04-20: 2.5 mg via ORAL
  Administered 2021-04-21: 0.625 mg via ORAL
  Filled 2021-04-19 (×2): qty 1

## 2021-04-19 MED ORDER — PANTOPRAZOLE SODIUM 40 MG PO TBEC
40.0000 mg | DELAYED_RELEASE_TABLET | Freq: Every day | ORAL | Status: DC
  Administered 2021-04-20: 40 mg via ORAL
  Filled 2021-04-19: qty 1

## 2021-04-19 MED ORDER — ONDANSETRON HCL 4 MG/2ML IJ SOLN
4.0000 mg | Freq: Once | INTRAMUSCULAR | Status: AC
  Administered 2021-04-19: 4 mg via INTRAVENOUS
  Filled 2021-04-19: qty 2

## 2021-04-19 MED ORDER — ROSUVASTATIN CALCIUM 5 MG PO TABS
5.0000 mg | ORAL_TABLET | Freq: Every day | ORAL | Status: DC
  Administered 2021-04-20: 5 mg via ORAL
  Filled 2021-04-19 (×2): qty 1

## 2021-04-19 MED ORDER — HEPARIN SODIUM (PORCINE) 5000 UNIT/ML IJ SOLN
5000.0000 [IU] | Freq: Three times a day (TID) | INTRAMUSCULAR | Status: DC
  Administered 2021-04-19 – 2021-04-20 (×3): 5000 [IU] via SUBCUTANEOUS
  Filled 2021-04-19 (×3): qty 1

## 2021-04-19 MED ORDER — ACETAMINOPHEN 325 MG PO TABS
650.0000 mg | ORAL_TABLET | Freq: Four times a day (QID) | ORAL | Status: DC | PRN
Start: 2021-04-19 — End: 2021-04-21
  Administered 2021-04-20: 650 mg via ORAL
  Filled 2021-04-19: qty 2

## 2021-04-19 MED ORDER — ACETAMINOPHEN 650 MG RE SUPP: 650.0000 mg | Freq: Four times a day (QID) | RECTAL | Status: DC | PRN

## 2021-04-19 MED ORDER — LEFLUNOMIDE 20 MG PO TABS
20.0000 mg | ORAL_TABLET | Freq: Every day | ORAL | Status: DC
  Administered 2021-04-20 – 2021-04-21 (×2): 20 mg via ORAL
  Filled 2021-04-19 (×2): qty 1

## 2021-04-19 MED ORDER — SPIRONOLACTONE 100 MG PO TABS
100.0000 mg | ORAL_TABLET | Freq: Two times a day (BID) | ORAL | Status: DC
  Administered 2021-04-20 – 2021-04-21 (×2): 100 mg via ORAL
  Filled 2021-04-19 (×3): qty 1

## 2021-04-19 MED ORDER — SODIUM CHLORIDE 0.9 % IV SOLN: INTRAVENOUS | Status: AC

## 2021-04-19 MED ORDER — SODIUM CHLORIDE 0.9 % IV SOLN
2.0000 g | INTRAVENOUS | Status: DC
  Administered 2021-04-19 – 2021-04-20 (×2): 2 g via INTRAVENOUS
  Filled 2021-04-19: qty 20
  Filled 2021-04-19 (×2): qty 2

## 2021-04-19 MED ORDER — PROGESTERONE MICRONIZED 100 MG PO CAPS
100.0000 mg | ORAL_CAPSULE | Freq: Every day | ORAL | Status: DC
  Filled 2021-04-19: qty 1

## 2021-04-19 MED ORDER — SODIUM CHLORIDE 0.9% FLUSH
3.0000 mL | Freq: Two times a day (BID) | INTRAVENOUS | Status: DC
  Administered 2021-04-19: 3 mL via INTRAVENOUS

## 2021-04-19 MED ORDER — ONDANSETRON HCL 4 MG PO TABS: 4.0000 mg | ORAL_TABLET | Freq: Four times a day (QID) | ORAL | 0 refills | Status: DC

## 2021-04-19 NOTE — ED Triage Notes (Signed)
Pt arrived via EMS, from home , n/v since today.  Gallbladder removed last month. VSS.    326 CBG 4 mg Zofran PO

## 2021-04-19 NOTE — Consult Note (Signed)
Reason for Consult:nausea, emesis, r/o internal hernia Referring Physician: Dr. Versie Starks   Jacqueline Coleman is an 60 y.o. female.  HPI: This is a 60 year old female with multiple medical problems including insulin-dependent diabetes who is just over 4 weeks status post laparoscopic cholecystectomy for cholecystitis and cholelithiasis by Dr. Derrell Lolling.  She reports that she started having some periumbilical abdominal pain on Wednesday.  Today she developed multiple episodes of emesis as well as diarrhea. She had a noncontrasted CT scan of the abdomen and pelvis.  On the noncontrasted scan, there is a questionable finding of small bowel lateral to the descending colon which we raised the question for an internal hernia but there were no other findings suggestive of this.  There was no other dilated bowel.  She had a nasogastric tube placed but is now having extreme dry heaving and gagging secondary to the nasogastric tube.  Past Medical History:  Diagnosis Date  . Ankylosing spondylitis (HCC)   . Diabetes mellitus without complication (HCC)   . Osteoporosis   . Rheumatoid arthritis [facility])     Past Surgical History:  Procedure Laterality Date  . CARPAL TUNNEL RELEASE Right 2003  . CESAREAN SECTION    . CHOLECYSTECTOMY N/A 03/19/2021   Procedure: LAPAROSCOPIC CHOLECYSTECTOMY;  Surgeon: Axel Filler, MD;  Location: [facility];  Service: General;  Laterality: N/A;    Family History  Problem Relation Age of Onset  . Diabetes Mother   . Diabetes Father   . Breast cancer Neg Hx     Social History:  reports that she has been smoking cigarettes. She has a 4.50 pack-year smoking history. She has never used smokeless tobacco. She reports previous alcohol use. She reports current drug use. Drug: Marijuana.  Allergies:  Allergies  Allergen Reactions  . Methotrexate Other (See Comments)    Put pt in DKA  . Adhesive [Tape] Other (See Comments)    Blister    . Codeine Itching  . Farxiga  [Dapagliflozin]   . Hydrocodone Itching  . Lisinopril Other (See Comments)    Back pain  . Losartan Other (See Comments)    Back pain   . Prednisone Other (See Comments)    Adverse reaction     Medications: I have reviewed the patient's current medications.  Results for orders placed or performed during the hospital encounter of 04/19/21 (from the past 48 hour(s))  Resp Panel by RT-PCR (Flu A&B, Covid) Nasopharyngeal Swab     Status: None   Collection Time: 04/19/21 10:45 AM   Specimen: Nasopharyngeal Swab; Nasopharyngeal(NP) swabs in vial transport medium  Result Value Ref Range   SARS Coronavirus 2 by RT PCR NEGATIVE NEGATIVE    Comment: (NOTE) SARS-CoV-2 target nucleic acids are NOT DETECTED.  The SARS-CoV-2 RNA is generally detectable in upper respiratory specimens during the acute phase of infection. The lowest concentration of SARS-CoV-2 viral copies this assay can detect is 138 copies/mL. A negative result does not preclude SARS-Cov-2 infection and should not be used as the sole basis for treatment or other patient management decisions. A negative result may occur with  improper specimen collection/handling, submission of specimen other than nasopharyngeal swab, presence of viral mutation(s) within the areas targeted by this assay, and inadequate number of viral copies(<138 copies/mL). A negative result must be combined with clinical observations, patient history, and epidemiological information. The expected result is Negative.  Fact Sheet for Patients:  BloggerCourse.com  Fact Sheet for Healthcare Providers:  SeriousBroker.it  This test is no t  yet approved or cleared by the [place] and  has been authorized for detection and/or diagnosis of SARS-CoV-2 by FDA under an Emergency Use Authorization (EUA). This EUA will remain  in effect (meaning this test can be used) for the duration of the COVID-19  declaration under Section 564(b)(1) of the Act, 21 U.S.C.section 360bbb-3(b)(1), unless the authorization is terminated  or revoked sooner.       Influenza A by PCR NEGATIVE NEGATIVE   Influenza B by PCR NEGATIVE NEGATIVE    Comment: (NOTE) The Xpert Xpress SARS-CoV-2/FLU/RSV plus assay is intended as an aid in the diagnosis of influenza from Nasopharyngeal swab specimens and should not be used as a sole basis for treatment. Nasal washings and aspirates are unacceptable for Xpert Xpress SARS-CoV-2/FLU/RSV testing.  Fact Sheet for Patients: BloggerCourse.com  Fact Sheet for Healthcare Providers: SeriousBroker.it  This test is not yet approved or cleared by the [place] FDA and has been authorized for detection and/or diagnosis of SARS-CoV-2 by FDA under an Emergency Use Authorization (EUA). This EUA will remain in effect (meaning this test can be used) for the duration of the COVID-19 declaration under Section 564(b)(1) of the Act, 21 U.S.C. section 360bbb-3(b)(1), unless the authorization is terminated or revoked.  Performed at [facility], [address] [place], [place], [place] [address]   CBC with Differential     Status: Abnormal   Collection Time: 04/19/21  3:04 PM  Result Value Ref Range   WBC 17.3 (H) 4.0 - 10.5 K/uL   RBC 5.67 (H) 3.87 - 5.11 MIL/uL   Hemoglobin 16.7 (H) 12.0 - 15.0 g/dL   HCT 29.5 (H) 62.1 - 30.8 %   MCV 90.8 80.0 - 100.0 fL   MCH 29.5 26.0 - 34.0 pg   MCHC 32.4 30.0 - 36.0 g/dL   RDW 65.7 84.6 - 96.2 %   Platelets 256 150 - 400 K/uL   nRBC 0.0 0.0 - 0.2 %   Neutrophils Relative % 87 %   Neutro Abs 15.0 (H) 1.7 - 7.7 K/uL   Lymphocytes Relative 8 %   Lymphs Abs 1.4 0.7 - 4.0 K/uL   Monocytes Relative 4 %   Monocytes Absolute 0.8 0.1 - 1.0 K/uL   Eosinophils Relative 0 %   Eosinophils Absolute 0.0 0.0 - 0.5 K/uL   Basophils Relative 0 %   Basophils Absolute 0.1 0.0 - 0.1  K/uL   Immature Granulocytes 1 %   Abs Immature Granulocytes 0.15 (H) 0.00 - 0.07 K/uL    Comment: Performed at [facility], [address] [place], [place] [address]  Comprehensive metabolic panel     Status: Abnormal   Collection Time: 04/19/21  3:04 PM  Result Value Ref Range   Sodium 138 135 - 145 mmol/L   Potassium 4.5 3.5 - 5.1 mmol/L   Chloride 106 98 - 111 mmol/L   CO2 20 (L) 22 - 32 mmol/L   Glucose, Bld 275 (H) 70 - 99 mg/dL    Comment: Glucose reference range applies only to samples taken after fasting for at least 8 hours.   BUN 11 6 - 20 mg/dL   Creatinine, Ser 1.32 0.44 - 1.00 mg/dL   Calcium 9.8 8.9 - 44.0 mg/dL   Total Protein 7.8 6.5 - 8.1 g/dL   Albumin 4.3 3.5 - 5.0 g/dL   AST 23 15 - 41 U/L   ALT 26 0 - 44 U/L   Alkaline Phosphatase 77 38 - 126  U/L   Total Bilirubin 0.2 (L) 0.3 - 1.2 mg/dL   GFR, Estimated >46 >27 mL/min    Comment: (NOTE) Calculated using the CKD-EPI Creatinine Equation (2021)    Anion gap 12 5 - 15    Comment: Performed at [facility], [address] [place], [place] [address]  Lipase, blood     Status: None   Collection Time: 04/19/21  3:04 PM  Result Value Ref Range   Lipase 26 11 - 51 U/L    Comment: Performed at [facility], [address], [place], [place] [address]  Urinalysis, Routine w reflex microscopic     Status: Abnormal   Collection Time: 04/19/21  3:04 PM  Result Value Ref Range   Color, Urine YELLOW YELLOW   APPearance CLEAR CLEAR   Specific Gravity, Urine 1.029 1.005 - 1.030   pH 5.0 5.0 - 8.0   Glucose, UA >=500 (A) NEGATIVE mg/dL   Hgb urine dipstick NEGATIVE NEGATIVE   Bilirubin Urine NEGATIVE NEGATIVE   Ketones, ur 80 (A) NEGATIVE mg/dL   Protein, ur NEGATIVE NEGATIVE mg/dL   Nitrite NEGATIVE NEGATIVE   Leukocytes,Ua NEGATIVE NEGATIVE   WBC, UA 0-5 0 - 5 WBC/hpf   Bacteria, UA NONE SEEN NONE SEEN   Squamous Epithelial / LPF 0-5 0 - 5    Comment:  Performed at [facility], [address] [place], [place] [address]  CBG monitoring, ED     Status: Abnormal   Collection Time: 04/19/21  4:07 PM  Result Value Ref Range   Glucose-Capillary 239 (H) 70 - 99 mg/dL    Comment: Glucose reference range applies only to samples taken after fasting for at least 8 hours.  Lactic acid, plasma     Status: Abnormal   Collection Time: 04/19/21  6:14 PM  Result Value Ref Range   Lactic Acid, Venous 2.4 (HH) 0.5 - 1.9 mmol/L    Comment: CRITICAL RESULT CALLED TO, READ BACK BY AND VERIFIED WITH: J.Alvira Philips, RN AT [phone] ON 05.13.22 BY N.THOMPSON Performed at [facility], [address], [place], [place] [address]     CT ABDOMEN PELVIS WO CONTRAST  Result Date: 04/19/2021 CLINICAL DATA:  60 year old with acute RIGHT UPPER QUADRANT and RIGHT LOWER QUADRANT abdominal pain associated with nausea. Recent cholecystectomy on 03/19/2021. EXAM: CT ABDOMEN AND PELVIS WITHOUT CONTRAST TECHNIQUE: Multidetector CT imaging of the abdomen and pelvis was performed following the standard protocol without IV contrast. COMPARISON:  No prior CT.  Abdominal ultrasound 03/14/2021. FINDINGS: Lower chest: Normal heart size with a solitary calcified plaque in the LEFT circumflex coronary artery. Visualized lung bases clear. Hepatobiliary: Normal unenhanced appearance of the liver. Surgically absent gallbladder. No biliary ductal dilation. No abnormal fluid collection in the gallbladder fossa. Pancreas: Mildly atrophic without evidence of mass or peripancreatic inflammation. Spleen: Normal unenhanced appearance. Adrenals/Urinary Tract: Normal appearing adrenal glands. Focal scarring involving the mid LEFT kidney. Within the limits of the unenhanced technique, no focal parenchymal abnormality involving either kidney otherwise. No hydronephrosis. No urinary tract calculi. Normal appearing urinary bladder. Stomach/Bowel: Small hiatal hernia. Stomach  otherwise normal in appearance for degree distension. Normal-appearing small bowel. There are normal small bowel loops located lateral to the descending colon. Entire colon decompressed with a small stool burden. Sigmoid colon diverticulosis without evidence of acute diverticulitis. Mobile cecum positioned in the RIGHT mid abdomen. Normal appendix in the RIGHT UPPER QUADRANT. Vascular/Lymphatic: Mild atherosclerosis involving the abdominal aorta without evidence of aneurysm.  No pathologic lymphadenopathy. Reproductive: Normal-appearing uterus and ovaries without evidence of adnexal mass. Other: Periumbilical scarring at the laparoscopic portal site. No evidence of associated cellulitis or abscess. Musculoskeletal: Mild lower thoracic spondylosis. Facet degenerative changes at L4-5 and L5-S1. No acute findings. IMPRESSION: 1. No acute abnormalities involving the abdomen or pelvis. 2. Scarring in the LEFT mid kidney. 3. Small hiatal hernia. 4. Normal appearing small bowel loops located lateral to the descending colon raising the question of an internal hernia. Aortic Atherosclerosis (ICD10-170.0) Electronically Signed   By: Hulan Saas M.D.   On: 04/19/2021 18:04    Review of Systems  Constitutional: Negative for chills and fever.  Respiratory: Negative for cough and shortness of breath.   Gastrointestinal: Positive for abdominal pain, diarrhea, nausea and vomiting.  Genitourinary: Negative for dysuria.  All other systems reviewed and are negative.  Blood pressure (!) 144/112, pulse 69, temperature 97.6 F (36.4 C), temperature source Oral, resp. rate (!) 8, SpO2 98 %. Physical Exam Constitutional:      Appearance: Normal appearance. She is obese.     Comments: Currently uncomfortable and gagging from the nasogastric tube  HENT:     Head: Normocephalic and atraumatic.     Right Ear: External ear normal.     Left Ear: External ear normal.     Nose: Nose normal.  Eyes:     General: No scleral  icterus.    Pupils: Pupils are equal, round, and reactive to light.  Cardiovascular:     Rate and Rhythm: Normal rate and regular rhythm.     Pulses: Normal pulses.     Heart sounds: Normal heart sounds.  Pulmonary:     Effort: Pulmonary effort is normal.     Breath sounds: Normal breath sounds.  Abdominal:     General: There is no distension.     Palpations: Abdomen is soft.     Hernia: No hernia is present.     Comments: Currently, her abdomen is fairly benign on exam.  There is no evidence of hernia at the umbilicus and her incisions are well-healed.  There is no peritonitis or guarding currently on physical examination  Musculoskeletal:        General: No deformity. Normal range of motion.     Cervical back: Normal range of motion and neck supple.  Skin:    General: Skin is warm and dry.  Neurological:     General: No focal deficit present.     Mental Status: She is alert and oriented to person, place, and time.  Psychiatric:        Behavior: Behavior normal.        Judgment: Judgment normal.     Assessment/Plan: Postoperative nausea and vomiting with diarrhea  I have reviewed the CAT scan of the abdomen and pelvis.  Again, this is a noncontrasted study.  There is no dilated small bowel or evidence of obstruction.  There is no stranding in the mesentery or swirling.  The only question of an internal hernia was secondary to small bowel being located to the left of the colon on the scan.  Her symptoms are not consistent with an internal hernia or bowel obstruction given the diarrhea. She is having such anxiety with gagging and dry heaves from the nasogastric tube that I believe it can safely be removed.  We will repeat abdominal x-rays in the morning but I would leave her currently n.p.o. for now.  If she does not improve, we can consider a  small bowel protocol x-ray versus repeat CT scan with contrast.  We will follow her with you.  From a postcholecystectomy standpoint, her liver  function tests are unremarkable and there is no dilation of the bile duct or evidence of abscess in the gallbladder fossa  Abigail Miyamoto 04/19/2021, 7:33 PM

## 2021-04-19 NOTE — ED Provider Notes (Signed)
Took patient care handoff report from [employer], [place]. Plan: Admit admit via hospitalist.  HPI from [employer], PA-C: "Pt is a 60 y/o female with a h/o ankylosing spondylitis, DM, osteoporosis, RA, who presents to the ED today for eval of nausea and vomiting that started this morning. She started feeling poorly a few days ago and had nose pain. She was dx with cellulitis on her nose and was started on augmentin 2 days ago and the skin infection appears to be improving. Denies abd pain. Reports diarrhea since she had her gallbladder removed last month. Denies fevers. Reports rhinorrhea due to allergies but denies any cough or sore throat."   MDM from Cortni Couture: "60 year old female presents the emergency department today for evaluation of nausea and vomiting.  Patient had surgery 1 month ago for her gallbladder.  She woke up with nausea vomiting this morning.  She has been being treated for a cellulitis to her nose with antibiotics and she states this has been improving.  Reviewed/interpreted labs CBC shows a leukocytosis at 17,000 CMP shows elevated blood glucose but is otherwise reassuring Lipase is negative UA shows ketonuria but otherwise no signs of infection COVID/flu testing is negative  CT abdomen/pelvis -  1. No acute abnormalities involving the abdomen or pelvis. 2. Scarring in the LEFT mid kidney. 3. Small hiatal hernia. 4. Normal appearing small bowel loops located lateral to the descending colon raising the question of an internal hernia. Aortic Atherosclerosis (ICD10-170.0)  6:10 PM CONSULT with Dr. Magnus Ivan who reviewed the CT scan. He recommends ng tube and admit to hospitalist  At shift change, care signed out to Harolyn Rutherford, [place] pending admission"  Clinical Course as of 04/19/21 1906  Fri Apr 19, 2021  1850 Spoke with Dr. Alinda Money, hospitalist. Agrees to admit the patient.  [SJ]    Clinical Course User Index [SJ] Anselm Pancoast, [place]   Vitals:   04/19/21  1652 04/19/21 1700 04/19/21 1730 04/19/21 1800  BP: (!) 150/89 (!) 132/42 (!) 180/67 (!) 144/112  Pulse: (!) 59 (!) 52 60 69  Resp: 15  18 (!) 8  Temp:      TempSrc:      SpO2: 100% 98% 98% 98%     CT ABDOMEN PELVIS WO CONTRAST  Result Date: 04/19/2021 CLINICAL DATA:  60 year old with acute RIGHT UPPER QUADRANT and RIGHT LOWER QUADRANT abdominal pain associated with nausea. Recent cholecystectomy on 03/19/2021. EXAM: CT ABDOMEN AND PELVIS WITHOUT CONTRAST TECHNIQUE: Multidetector CT imaging of the abdomen and pelvis was performed following the standard protocol without IV contrast. COMPARISON:  No prior CT.  Abdominal ultrasound 03/14/2021. FINDINGS: Lower chest: Normal heart size with a solitary calcified plaque in the LEFT circumflex coronary artery. Visualized lung bases clear. Hepatobiliary: Normal unenhanced appearance of the liver. Surgically absent gallbladder. No biliary ductal dilation. No abnormal fluid collection in the gallbladder fossa. Pancreas: Mildly atrophic without evidence of mass or peripancreatic inflammation. Spleen: Normal unenhanced appearance. Adrenals/Urinary Tract: Normal appearing adrenal glands. Focal scarring involving the mid LEFT kidney. Within the limits of the unenhanced technique, no focal parenchymal abnormality involving either kidney otherwise. No hydronephrosis. No urinary tract calculi. Normal appearing urinary bladder. Stomach/Bowel: Small hiatal hernia. Stomach otherwise normal in appearance for degree distension. Normal-appearing small bowel. There are normal small bowel loops located lateral to the descending colon. Entire colon decompressed with a small stool burden. Sigmoid colon diverticulosis without evidence of acute diverticulitis. Mobile cecum positioned in the RIGHT mid abdomen. Normal appendix in the  RIGHT UPPER QUADRANT. Vascular/Lymphatic: Mild atherosclerosis involving the abdominal aorta without evidence of aneurysm. No pathologic lymphadenopathy.  Reproductive: Normal-appearing uterus and ovaries without evidence of adnexal mass. Other: Periumbilical scarring at the laparoscopic portal site. No evidence of associated cellulitis or abscess. Musculoskeletal: Mild lower thoracic spondylosis. Facet degenerative changes at L4-5 and L5-S1. No acute findings. IMPRESSION: 1. No acute abnormalities involving the abdomen or pelvis. 2. Scarring in the LEFT mid kidney. 3. Small hiatal hernia. 4. Normal appearing small bowel loops located lateral to the descending colon raising the question of an internal hernia. Aortic Atherosclerosis (ICD10-170.0) Electronically Signed   By: Hulan Saas M.D.   On: 04/19/2021 18:04    Abnormal Labs Reviewed  CBC WITH DIFFERENTIAL/PLATELET - Abnormal; Notable for the following components:      Result Value   WBC 17.3 (*)    RBC 5.67 (*)    Hemoglobin 16.7 (*)    HCT 51.5 (*)    Neutro Abs 15.0 (*)    Abs Immature Granulocytes 0.15 (*)    All other components within normal limits  COMPREHENSIVE METABOLIC PANEL - Abnormal; Notable for the following components:   CO2 20 (*)    Glucose, Bld 275 (*)    Total Bilirubin 0.2 (*)    All other components within normal limits  URINALYSIS, ROUTINE W REFLEX MICROSCOPIC - Abnormal; Notable for the following components:   Glucose, UA >=500 (*)    Ketones, ur 80 (*)    All other components within normal limits  CBG MONITORING, ED - Abnormal; Notable for the following components:   Glucose-Capillary 239 (*)    All other components within normal limits       Concepcion Living 04/19/21 1906    Mancel Bale, MD 04/19/21 2313

## 2021-04-19 NOTE — H&P (Addendum)
History and Physical   Jacqueline Coleman JXB:[id number] DOB: Sep 10, 1961 DOA: 04/19/2021  PCP: Tally Joe, MD   Patient coming from: Home  Chief Complaint: Nausea and vomiting  HPI: Jacqueline Coleman is a 60 y.o. female with medical history significant of ankylosing spondylitis, rheumatoid arthritis, lymphedema, diabetes, gastroparesis, hyperlipidemia, low back pain, cholelithiasis with cholecystectomy last month who presents with nausea and vomiting.  Patient states that she had nausea and vomiting starting this morning.  She has some degree of chronic episodes of nausea and vomiting for which she typically does fluid restriction which she tried today.  She also typically has antiemetics although she was out of these. She reports that she has had some chronic diarrhea since having her cholecystectomy last month.  Reported to the ED due to persistent symptoms.  She denies fevers, chills, chest pain, shortness of breath, abdominal pain, constipation.  Recently started on antibiotic Augmentin which she has not been on before for recent diagnosis of cellulitis on her nose.  ED Course: Vital signs in the ED significant for blood pressure in the 140s systolic.  Lab work-up showed CMP with bicarb 20, glucose 275.  CBC with leukocytosis to 17.3 and hemoglobin of 16.7.  Lipase negative.  Lactic acid pending.  Respiratory panel for flu and COVID-negative.  Urinalysis showing glucose and ketones.  CT on pelvis showed no acute abnormality but did show a normal loop of small bowel lateral to the descending colon concerning for internal hernia.  General surgery was consulted considering her recent operation and also have concern for internal hernia and possible developing SBO.  Recommend NG tube and will see the patient.  Review of Systems: As per HPI otherwise all other systems reviewed and are negative.  Past Medical History:  Diagnosis Date  . Ankylosing spondylitis (HCC)   . Diabetes mellitus  without complication (HCC)   . Osteoporosis   . Rheumatoid arthritis [facility])     Past Surgical History:  Procedure Laterality Date  . CARPAL TUNNEL RELEASE Right 2003  . CESAREAN SECTION    . CHOLECYSTECTOMY N/A 03/19/2021   Procedure: LAPAROSCOPIC CHOLECYSTECTOMY;  Surgeon: Axel Filler, MD;  Location: [facility];  Service: General;  Laterality: N/A;    Social History  reports that she has been smoking cigarettes. She has a 4.50 pack-year smoking history. She has never used smokeless tobacco. She reports previous alcohol use. She reports current drug use. Drug: Marijuana.  Allergies  Allergen Reactions  . Methotrexate Other (See Comments)    Put pt in DKA  . Adhesive [Tape] Other (See Comments)    Blister - Brown stretching bandaid    . Codeine Itching  . Farxiga [Dapagliflozin]   . Hydrocodone Itching  . Lisinopril Other (See Comments)    Back pain  . Losartan Other (See Comments)    Back pain   . Metformin Hcl     Other reaction(s): Nausea  . Prednisone Other (See Comments)    Adverse reaction     Family History  Problem Relation Age of Onset  . Diabetes Mother   . Diabetes Father   . Breast cancer Neg Hx   Reviewed on admission  Prior to Admission medications   Medication Sig Start Date End Date Taking? Authorizing Provider  ondansetron (ZOFRAN) 4 MG tablet Take 1 tablet (4 mg total) by mouth every 6 (six) hours. 04/19/21  Yes Couture, Cortni S, PA-C  acetaminophen (TYLENOL) 500 MG tablet Take 2 tablets (1,000 mg total) by mouth every 6 (six)  hours as needed. 03/20/21   Barnetta Chapel, PA-C  cetirizine (ZYRTEC ALLERGY) 10 MG tablet Take 10 mg by mouth daily.    [provider]  Continuous Blood Gluc Sensor (DEXCOM G6 SENSOR) MISC SMARTSIG:1 Each Topical Every 10 Days 08/12/20   [provider]  Continuous Blood Gluc Transmit (DEXCOM G6 TRANSMITTER) MISC  05/28/20   [provider]  estradiol (ESTRACE) 2 MG tablet Take 2 mg by mouth every  other day. 02/28/19   [provider]  finasteride (PROSCAR) 5 MG tablet Take 5 mg by mouth every other day.    [provider]  golimumab (SIMPONI ARIA) 50 MG/4ML SOLN injection Inject 12.5 mg into the vein every 8 (eight) weeks.    [provider]  ibuprofen (MOTRIN IB) 200 MG tablet Take 3 tablets (600 mg total) by mouth every 8 (eight) hours as needed. 03/20/21 03/20/22  Barnetta Chapel, PA-C  insulin lispro (HUMALOG) 100 UNIT/ML injection Humalog U-100 Insulin 100 unit/mL subcutaneous solution    [provider]  ketoconazole (NIZORAL) 2 % shampoo Apply 1 application topically daily. 08/11/19   [provider]  leflunomide (ARAVA) 10 MG tablet Take 20 mg by mouth daily. 07/08/20   [provider]  minoxidil (LONITEN) 2.5 MG tablet Take 0.25 mg by mouth daily.    [provider]  omeprazole (PRILOSEC) 20 MG capsule Take 2 capsules by mouth daily.    [provider]  progesterone (PROMETRIUM) 100 MG capsule Take 100 mg by mouth at bedtime. 07/08/20   [provider]  rosuvastatin (CRESTOR) 5 MG tablet Take 5 mg by mouth daily. 01/30/21   [provider]  spironolactone (ALDACTONE) 100 MG tablet Take 100 mg by mouth 2 (two) times daily. 08/06/19   [provider]    Physical Exam: Vitals:   04/19/21 1700 04/19/21 1730 04/19/21 1800 04/19/21 1930  BP: (!) 132/42 (!) 180/67 (!) 144/112 (!) 143/66  Pulse: (!) 52 60 69 (!) 58  Resp:  18 (!) 8 16  Temp:    98.4 F (36.9 C)  TempSrc:    Oral  SpO2: 98% 98% 98% 97%   Physical Exam Constitutional:      General: She is not in acute distress.    Appearance: Normal appearance. She is ill-appearing.     Comments: NGT in place, intermittent gagging  HENT:     Head: Normocephalic and atraumatic.     Mouth/Throat:     Mouth: Mucous membranes are moist.     Pharynx: Oropharynx is clear.  Eyes:     Extraocular Movements: Extraocular movements intact.      Pupils: Pupils are equal, round, and reactive to light.  Cardiovascular:     Rate and Rhythm: Normal rate and regular rhythm.     Pulses: Normal pulses.     Heart sounds: Normal heart sounds.  Pulmonary:     Effort: Pulmonary effort is normal. No respiratory distress.     Breath sounds: Normal breath sounds.  Abdominal:     General: Bowel sounds are normal. There is no distension.     Palpations: Abdomen is soft.     Tenderness: There is abdominal tenderness in the periumbilical area.  Musculoskeletal:        General: No swelling or deformity.  Skin:    General: Skin is warm and dry.  Neurological:     General: No focal deficit present.     Mental Status: Mental status is at baseline.  Labs on Admission: I have personally reviewed following labs and imaging studies  CBC: Recent Labs  Lab 04/19/21 1504  WBC 17.3*  NEUTROABS 15.0*  HGB 16.7*  HCT 51.5*  MCV 90.8  PLT 256    Basic Metabolic Panel: Recent Labs  Lab 04/19/21 1504  NA 138  K 4.5  CL 106  CO2 20*  GLUCOSE 275*  BUN 11  CREATININE 0.64  CALCIUM 9.8    GFR: Estimated Creatinine Clearance: 81.9 mL/min (by C-G formula based on SCr of 0.64 mg/dL).  Liver Function Tests: Recent Labs  Lab 04/19/21 1504  AST 23  ALT 26  ALKPHOS 77  BILITOT 0.2*  PROT 7.8  ALBUMIN 4.3    Urine analysis:    Component Value Date/Time   COLORURINE YELLOW 04/19/2021 1504   APPEARANCEUR CLEAR 04/19/2021 1504   LABSPEC 1.029 04/19/2021 1504   PHURINE 5.0 04/19/2021 1504   GLUCOSEU >=500 (A) 04/19/2021 1504   HGBUR NEGATIVE 04/19/2021 1504   BILIRUBINUR NEGATIVE 04/19/2021 1504   KETONESUR 80 (A) 04/19/2021 1504   PROTEINUR NEGATIVE 04/19/2021 1504   NITRITE NEGATIVE 04/19/2021 1504   LEUKOCYTESUR NEGATIVE 04/19/2021 1504    Radiological Exams on Admission: CT ABDOMEN PELVIS WO CONTRAST  Result Date: 04/19/2021 CLINICAL DATA:  60 year old with acute RIGHT UPPER QUADRANT and RIGHT LOWER QUADRANT abdominal  pain associated with nausea. Recent cholecystectomy on 03/19/2021. EXAM: CT ABDOMEN AND PELVIS WITHOUT CONTRAST TECHNIQUE: Multidetector CT imaging of the abdomen and pelvis was performed following the standard protocol without IV contrast. COMPARISON:  No prior CT.  Abdominal ultrasound 03/14/2021. FINDINGS: Lower chest: Normal heart size with a solitary calcified plaque in the LEFT circumflex coronary artery. Visualized lung bases clear. Hepatobiliary: Normal unenhanced appearance of the liver. Surgically absent gallbladder. No biliary ductal dilation. No abnormal fluid collection in the gallbladder fossa. Pancreas: Mildly atrophic without evidence of mass or peripancreatic inflammation. Spleen: Normal unenhanced appearance. Adrenals/Urinary Tract: Normal appearing adrenal glands. Focal scarring involving the mid LEFT kidney. Within the limits of the unenhanced technique, no focal parenchymal abnormality involving either kidney otherwise. No hydronephrosis. No urinary tract calculi. Normal appearing urinary bladder. Stomach/Bowel: Small hiatal hernia. Stomach otherwise normal in appearance for degree distension. Normal-appearing small bowel. There are normal small bowel loops located lateral to the descending colon. Entire colon decompressed with a small stool burden. Sigmoid colon diverticulosis without evidence of acute diverticulitis. Mobile cecum positioned in the RIGHT mid abdomen. Normal appendix in the RIGHT UPPER QUADRANT. Vascular/Lymphatic: Mild atherosclerosis involving the abdominal aorta without evidence of aneurysm. No pathologic lymphadenopathy. Reproductive: Normal-appearing uterus and ovaries without evidence of adnexal mass. Other: Periumbilical scarring at the laparoscopic portal site. No evidence of associated cellulitis or abscess. Musculoskeletal: Mild lower thoracic spondylosis. Facet degenerative changes at L4-5 and L5-S1. No acute findings. IMPRESSION: 1. No acute abnormalities involving  the abdomen or pelvis. 2. Scarring in the LEFT mid kidney. 3. Small hiatal hernia. 4. Normal appearing small bowel loops located lateral to the descending colon raising the question of an internal hernia. Aortic Atherosclerosis (ICD10-170.0) Electronically Signed   By: Hulan Saas M.D.   On: 04/19/2021 18:04   EKG: Not yet performed  Assessment/Plan Principal Problem:   Internal hernia Active Problems:   Abdominal pain   Type 2 diabetes mellitus with other specified complication (HCC)   Elevated blood-pressure reading, without diagnosis of hypertension   Gastroparesis   Intractable nausea and vomiting   Cellulitis  ?SBO Internal Hernia > Patient presenting with nausea vomiting starting  this a.m.  Noncontrast CT with concern for possible internal hernia.  Her symptoms could represent developing SBO > General surgery was consulted and recommended NG tube and admission. > Significant leukocytosis to 17.  Recently diagnosed with cellulitis of the neck.  Will cover with ceftriaxone for cellulitis and intra-abdominal. ?Could Augmentin be contributing to symptoms. - Appreciate general surgery recommendations - NG tube per surgery, patient states surgeon came by and told her they would be able to remove the NG tube - N.p.o. except sips with meds - Trend CBC  Leukocytosis Cellulitis > Patient recently diagnosed with cellulitis as above (nasal, covered when seen).  She has been started on Augmentin.  Noted to have leukocytosis to 17 in ED.  Which is up from 8 days ago at 8.2.  Does have some degree of chronic leukocytosis per chart review. > Possible that her Augmentin may have contributed to her GI upset. - Covering with ceftriaxone for cellulitis and intra-abdominal  Diabetes > On insulin pump at home  > History of gastroparesis - Considering her nausea vomiting will also cover with Reglan as needed - SSI every 4 hours  Elevated BP Edema > On a typical antihypertensive regimen due  to multiple allergies and intolerances - Continue home spironolactone, minoxidil  Hyperlipidemia - Continue home statin  Ankylosing spondylitis Rheumatoid arthritis - Noted  DVT prophylaxis: Heparin  Code Status:   Full  Family Communication:  Family updated at bedside. Disposition Plan:   Patient is from:  Home  Anticipated DC to:  Home  Anticipated DC date:  1 to 4 days  Anticipated DC barriers: None  Consults called:  General surgery consulted by EDP Admission status:  Observation, MedSurg   Severity of Illness: The appropriate patient status for this patient is OBSERVATION. Observation status is judged to be reasonable and necessary in order to provide the required intensity of service to ensure the patient's safety. The patient's presenting symptoms, physical exam findings, and initial radiographic and laboratory data in the context of their medical condition is felt to place them at decreased risk for further clinical deterioration. Furthermore, it is anticipated that the patient will be medically stable for discharge from the hospital within 2 midnights of admission. The following factors support the patient status of observation.   " The patient's presenting symptoms include nausea and vomiting. " The physical exam findings include abdominal tenderness. " The initial radiographic and laboratory data are CT scan showing normal small bowel lateral to the descending colon concerning for internal hernia.  Glucose 275, leukocytosis 17.3, hemoglobin 16.7, urinalysis glucose and ketones.Synetta Fail MD [facility]  How to contact the [facility] Attending or Consulting provider 7A - 7P or covering provider during after hours 7P -7A, for this patient?   1. Check the care team in [facility] and look for a) attending/consulting [facility] provider listed and b) the [facility] team listed 2. Log into [email or web address] and use [facility]'s universal password to access. If you do not have the  password, please contact the hospital operator. 3. Locate the [facility] provider you are looking for under [facility] and page to a number that you can be directly reached. 4. If you still have difficulty reaching the provider, please page the [facility] (Director on Call) for the Hospitalists listed on amion for assistance.  04/19/2021, 8:37 PM

## 2021-04-19 NOTE — ED Provider Notes (Signed)
[facility]-EMERGENCY DEPT Provider Note   CSN: [id number] Arrival date & time: 04/19/21  1030     History Chief Complaint  Patient presents with  . Nausea    Jacqueline Coleman is a 60 y.o. female.  HPI   Pt is a 60 y/o female with a h/o ankylosing spondylitis, DM, osteoporosis, RA, who presents to the ED today for eval of nausea and vomiting that started this morning. She started feeling poorly a few days ago and had nose pain. She was dx with cellulitis on her nose and was started on augmentin 2 days ago and the skin infection appears to be improving. Denies abd pain. Reports diarrhea since she had her gallbladder removed last month. Denies fevers. Reports rhinorrhea due to allergies but denies any cough or sore throat.   Past Medical History:  Diagnosis Date  . Ankylosing spondylitis (HCC)   . Diabetes mellitus without complication (HCC)   . Osteoporosis   . Rheumatoid arthritis [facility])     Patient Active Problem List   Diagnosis Date Noted  . Abdominal pain 03/19/2021  . Cholelithiasis 03/19/2021  . Vitamin B12 deficiency 03/19/2021  . Tobacco abuse 03/19/2021  . Presence of insulin pump (external) (internal) 03/19/2021  . Gastroparesis 03/19/2021  . Hyperlipidemia, unspecified 03/19/2021  . Lymphedema 03/19/2021  . Biliary colic 03/19/2021  . Ankylosing spondylitis (HCC) 12/27/2020  . Elevated blood-pressure reading, without diagnosis of hypertension 12/27/2020  . Obesity due to excess calories 11/28/2020  . 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) 11/28/2020  . Low back pain 10/31/2019  . Type 2 diabetes mellitus with other specified complication (HCC) 09/01/2019  . DKA (diabetic ketoacidoses) 08/20/2019  . Tobacco dependence 08/20/2019  . Marijuana abuse 08/20/2019  . Rheumatoid arthritis (HCC)   . Degeneration of lumbar intervertebral disc 10/23/2015  . Lumbar radiculopathy 08/17/2015  . 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 CARPAL TUNNEL RELEASE Right 2003  . CESAREAN SECTION    . CHOLECYSTECTOMY N/A 03/19/2021   Procedure: LAPAROSCOPIC CHOLECYSTECTOMY;  Surgeon: Axel Filler, MD;  Location: [facility] ORS;  Service: General;  Laterality: N/A;     OB History   No obstetric history on file.     Family History  Problem Relation Age of Onset  . Diabetes Mother   . Diabetes Father   . Breast cancer Neg Hx     Social History   Tobacco Use  . Smoking status: Current Every Day Smoker    Packs/day: 0.15    Years: 30.00    Pack years: 4.50    Types: Cigarettes  . Smokeless tobacco: Never Used  Vaping Use  . Vaping Use: Never used  Substance Use Topics  . Alcohol use: Not Currently  . Drug use: Yes    Types: Marijuana    Home Medications Prior to Admission medications   Medication Sig Start Date End Date Taking? Authorizing Provider  ondansetron (ZOFRAN) 4 MG tablet Take 1 tablet (4 mg total) by mouth every 6 (six) hours. 04/19/21  Yes Garland Hincapie S, PA-C  acetaminophen (TYLENOL) 500 MG tablet Take 2 tablets (1,000 mg total) by mouth every 6 (six) hours as needed. 03/20/21   Barnetta Chapel, PA-C  cetirizine (ZYRTEC ALLERGY) 10 MG tablet Take 10 mg by mouth daily.    [provider]  Continuous Blood Gluc Sensor (DEXCOM G6 SENSOR) MISC SMARTSIG:1 Each Topical Every 10 Days 08/12/20   [provider]  Continuous Blood Gluc Transmit (DEXCOM G6 TRANSMITTER) MISC  05/28/20   [provider]  estradiol (ESTRACE) 2 MG tablet Take 2 mg by mouth every other day. 02/28/19   [provider]  finasteride (PROSCAR) 5 MG tablet Take 5 mg by mouth every other day.    [provider]  golimumab (SIMPONI ARIA) 50 MG/4ML SOLN injection Inject 12.5 mg into the vein every 8 (eight) weeks.    [provider]  ibuprofen (MOTRIN IB) 200 MG tablet Take 3 tablets (600 mg total) by mouth every 8 (eight) hours as needed.  03/20/21 03/20/22  Barnetta Chapel, PA-C  insulin lispro (HUMALOG) 100 UNIT/ML injection Humalog U-100 Insulin 100 unit/mL subcutaneous solution    [provider]  ketoconazole (NIZORAL) 2 % shampoo Apply 1 application topically daily. 08/11/19   [provider]  leflunomide (ARAVA) 10 MG tablet Take 20 mg by mouth daily. 07/08/20   [provider]  minoxidil (LONITEN) 2.5 MG tablet Take 0.25 mg by mouth daily.    [provider]  omeprazole (PRILOSEC) 20 MG capsule Take 2 capsules by mouth daily.    [provider]  progesterone (PROMETRIUM) 100 MG capsule Take 100 mg by mouth at bedtime. 07/08/20   [provider]  rosuvastatin (CRESTOR) 5 MG tablet Take 5 mg by mouth daily. 01/30/21   [provider]  spironolactone (ALDACTONE) 100 MG tablet Take 100 mg by mouth 2 (two) times daily. 08/06/19   [provider]    Allergies    Methotrexate, Adhesive [tape], Codeine, Farxiga [dapagliflozin], Hydrocodone, Lisinopril, Losartan, and Prednisone  Review of Systems   Review of Systems  Constitutional: Negative for fever.  HENT: Negative for ear pain and sore throat.   Eyes: Negative for visual disturbance.  Respiratory: Negative for cough and shortness of breath.   Cardiovascular: Negative for chest pain.  Gastrointestinal: Positive for abdominal pain, nausea and vomiting. Negative for constipation and diarrhea.  Genitourinary: Negative for dysuria and hematuria.  Musculoskeletal: Negative for back pain.  Skin: Negative for rash.  Neurological: Negative for seizures and syncope.  All other systems reviewed and are negative.   Physical Exam Updated Vital Signs BP (!) 180/67   Pulse 60   Temp 97.6 F (36.4 C) (Oral)   Resp 18   SpO2 98%   Physical Exam Vitals and nursing note reviewed.  Constitutional:      General: She is not in acute distress.    Appearance: She is well-developed.  HENT:     Head: Normocephalic and  atraumatic.     Mouth/Throat:     Mouth: Mucous membranes are dry.  Eyes:     Conjunctiva/sclera: Conjunctivae normal.  Cardiovascular:     Rate and Rhythm: Normal rate and regular rhythm.     Heart sounds: Normal heart sounds. No murmur heard.   Pulmonary:     Effort: Pulmonary effort is normal. No respiratory distress.     Breath sounds: Normal breath sounds. No wheezing.  Abdominal:     General: Bowel sounds are normal.     Palpations: Abdomen is soft.     Tenderness: There is abdominal tenderness (RUQ, RLQ TTP with guarding). There is no guarding or rebound.  Musculoskeletal:     Cervical back: Neck supple.  Skin:    General: Skin is warm and dry.  Neurological:     Mental Status: She is alert.     ED Results / Procedures / Treatments   Labs (all labs ordered  are listed, but only abnormal results are displayed) Labs Reviewed  CBC WITH DIFFERENTIAL/PLATELET - Abnormal; Notable for the following components:      Result Value   WBC 17.3 (*)    RBC 5.67 (*)    Hemoglobin 16.7 (*)    HCT 51.5 (*)    Neutro Abs 15.0 (*)    Abs Immature Granulocytes 0.15 (*)    All other components within normal limits  COMPREHENSIVE METABOLIC PANEL - Abnormal; Notable for the following components:   CO2 20 (*)    Glucose, Bld 275 (*)    Total Bilirubin 0.2 (*)    All other components within normal limits  URINALYSIS, ROUTINE W REFLEX MICROSCOPIC - Abnormal; Notable for the following components:   Glucose, UA >=500 (*)    Ketones, ur 80 (*)    All other components within normal limits  CBG MONITORING, ED - Abnormal; Notable for the following components:   Glucose-Capillary 239 (*)    All other components within normal limits  RESP PANEL BY RT-PCR (FLU A&B, COVID) ARPGX2  LIPASE, BLOOD  LACTIC ACID, PLASMA  LACTIC ACID, PLASMA    EKG None  Radiology CT ABDOMEN PELVIS WO CONTRAST  Result Date: 04/19/2021 CLINICAL DATA:  60 year old with acute RIGHT UPPER QUADRANT and RIGHT  LOWER QUADRANT abdominal pain associated with nausea. Recent cholecystectomy on 03/19/2021. EXAM: CT ABDOMEN AND PELVIS WITHOUT CONTRAST TECHNIQUE: Multidetector CT imaging of the abdomen and pelvis was performed following the standard protocol without IV contrast. COMPARISON:  No prior CT.  Abdominal ultrasound 03/14/2021. FINDINGS: Lower chest: Normal heart size with a solitary calcified plaque in the LEFT circumflex coronary artery. Visualized lung bases clear. Hepatobiliary: Normal unenhanced appearance of the liver. Surgically absent gallbladder. No biliary ductal dilation. No abnormal fluid collection in the gallbladder fossa. Pancreas: Mildly atrophic without evidence of mass or peripancreatic inflammation. Spleen: Normal unenhanced appearance. Adrenals/Urinary Tract: Normal appearing adrenal glands. Focal scarring involving the mid LEFT kidney. Within the limits of the unenhanced technique, no focal parenchymal abnormality involving either kidney otherwise. No hydronephrosis. No urinary tract calculi. Normal appearing urinary bladder. Stomach/Bowel: Small hiatal hernia. Stomach otherwise normal in appearance for degree distension. Normal-appearing small bowel. There are normal small bowel loops located lateral to the descending colon. Entire colon decompressed with a small stool burden. Sigmoid colon diverticulosis without evidence of acute diverticulitis. Mobile cecum positioned in the RIGHT mid abdomen. Normal appendix in the RIGHT UPPER QUADRANT. Vascular/Lymphatic: Mild atherosclerosis involving the abdominal aorta without evidence of aneurysm. No pathologic lymphadenopathy. Reproductive: Normal-appearing uterus and ovaries without evidence of adnexal mass. Other: Periumbilical scarring at the laparoscopic portal site. No evidence of associated cellulitis or abscess. Musculoskeletal: Mild lower thoracic spondylosis. Facet degenerative changes at L4-5 and L5-S1. No acute findings. IMPRESSION: 1. No acute  abnormalities involving the abdomen or pelvis. 2. Scarring in the LEFT mid kidney. 3. Small hiatal hernia. 4. Normal appearing small bowel loops located lateral to the descending colon raising the question of an internal hernia. Aortic Atherosclerosis (ICD10-170.0) Electronically Signed   By: Hulan Saas M.D.   On: 04/19/2021 18:04    Procedures Procedures   Medications Ordered in ED Medications  promethazine (PHENERGAN) 12.5 mg in sodium chloride 0.9 % 50 mL IVPB (has no administration in time range)  sodium chloride 0.9 % bolus 1,000 mL (1,000 mLs Intravenous New Bag/Given 04/19/21 1616)  ondansetron (ZOFRAN) injection 4 mg (4 mg Intravenous Given 04/19/21 1650)  sodium chloride 0.9 % bolus 1,000 mL (1,000 mLs  Intravenous New Bag/Given 04/19/21 1650)    ED Course  I have reviewed the triage vital signs and the nursing notes.  Pertinent labs & imaging results that were available during my care of the patient were reviewed by me and considered in my medical decision making (see chart for details).    MDM Rules/Calculators/A&P                          60 year old female presents the emergency department today for evaluation of nausea and vomiting.  Patient had surgery 1 month ago for her gallbladder.  She woke up with nausea vomiting this morning.  She has been being treated for a cellulitis to her nose with antibiotics and she states this has been improving.  Reviewed/interpreted labs CBC shows a leukocytosis at 17,000 CMP shows elevated blood glucose but is otherwise reassuring Lipase is negative UA shows ketonuria but otherwise no signs of infection COVID/flu testing is negative  CT abdomen/pelvis -  1. No acute abnormalities involving the abdomen or pelvis. 2. Scarring in the LEFT mid kidney. 3. Small hiatal hernia. 4. Normal appearing small bowel loops located lateral to the descending colon raising the question of an internal hernia. Aortic Atherosclerosis (ICD10-170.0)  6:10  PM CONSULT with Dr. Magnus Ivan who reviewed the CT scan. He recommends ng tube and admit to hospitalist  At shift change, care signed out to Harolyn Rutherford, PA-C pending admission   Final Clinical Impression(s) / ED Diagnoses Final diagnoses:  Internal hernia    Rx / DC Orders ED Discharge Orders         Ordered    ondansetron (ZOFRAN) 4 MG tablet  Every 6 hours        04/19/21 1751           Karrie Meres, PA-C 04/19/21 1830    Wynetta Fines, MD 04/20/21 2313

## 2021-04-19 NOTE — ED Provider Notes (Signed)
Emergency Medicine Provider Triage Evaluation Note  Jacqueline Coleman , a 60 y.o. female  was evaluated in triage.  Pt complains of nausea vomiting onset today.  Nonbloody/nonbilious emesis.  No clear inciting event.  Denies any associate abdominal pain.  Reports cholecystectomy last month, no concerns between surgery and today.  Review of Systems  Positive: Nausea vomiting Negative: Fever chills chest pain shortness of breath abdominal pain dysuria hematuria  Physical Exam  BP (!) 160/76 (BP Location: Left Arm)   Pulse (!) 56   Temp 97.6 F (36.4 C) (Oral)   Resp 20   SpO2 99%  Gen:   Awake, no distress Resp:  Normal effort  MSK:   Moves extremities without difficulty  Other:  Abdomen: Well-healed surgical scars, mild generalized TTP.  Medical Decision Making  Medically screening exam initiated at 10:50 AM.  Appropriate orders placed.  Jacqueline Coleman was informed that the remainder of the evaluation will be completed by another provider, this initial triage assessment does not replace that evaluation, and the importance of remaining in the ED until their evaluation is complete.  Abdominal pain labs ordered along with IV fluids.  Patient will need further evaluation and treatment in main ED for diagnosis and disposition.  Note: Portions of this report may have been transcribed using voice recognition software. Every effort was made to ensure accuracy; however, inadvertent computerized transcription errors may still be present.   Jacqueline Coleman 04/19/21 1052    Wynetta Fines, MD 04/20/21 [phone]

## 2021-04-19 NOTE — Progress Notes (Signed)
Patient ID: Jacqueline Coleman, female   DOB: 1961-02-26, 60 y.o.   MRN: [id number]   Full consult to follow  I do not believe she has a bowel obstruction or internal hernia.  She is gagging tremendously more and having dry heaves with a nasogastric tube in place so I will order it to be removed.

## 2021-04-20 ENCOUNTER — Observation Stay (HOSPITAL_COMMUNITY): Payer: BC Managed Care – PPO

## 2021-04-20 ENCOUNTER — Other Ambulatory Visit: Payer: Self-pay

## 2021-04-20 DIAGNOSIS — R519 Headache, unspecified: Secondary | ICD-10-CM | POA: Diagnosis not present

## 2021-04-20 DIAGNOSIS — R079 Chest pain, unspecified: Secondary | ICD-10-CM | POA: Diagnosis not present

## 2021-04-20 DIAGNOSIS — D72829 Elevated white blood cell count, unspecified: Secondary | ICD-10-CM

## 2021-04-20 DIAGNOSIS — R112 Nausea with vomiting, unspecified: Secondary | ICD-10-CM | POA: Diagnosis not present

## 2021-04-20 DIAGNOSIS — K56609 Unspecified intestinal obstruction, unspecified as to partial versus complete obstruction: Secondary | ICD-10-CM

## 2021-04-20 DIAGNOSIS — R12 Heartburn: Secondary | ICD-10-CM | POA: Diagnosis not present

## 2021-04-20 DIAGNOSIS — R030 Elevated blood-pressure reading, without diagnosis of hypertension: Secondary | ICD-10-CM | POA: Diagnosis not present

## 2021-04-20 DIAGNOSIS — R1033 Periumbilical pain: Secondary | ICD-10-CM | POA: Diagnosis not present

## 2021-04-20 DIAGNOSIS — L03211 Cellulitis of face: Secondary | ICD-10-CM | POA: Diagnosis not present

## 2021-04-20 DIAGNOSIS — K458 Other specified abdominal hernia without obstruction or gangrene: Secondary | ICD-10-CM | POA: Diagnosis not present

## 2021-04-20 LAB — COMPREHENSIVE METABOLIC PANEL
ALT: 20 U/L (ref 0–44)
AST: 14 U/L — ABNORMAL LOW (ref 15–41)
Albumin: 3.4 g/dL — ABNORMAL LOW (ref 3.5–5.0)
Alkaline Phosphatase: 61 U/L (ref 38–126)
Anion gap: 9 (ref 5–15)
BUN: 10 mg/dL (ref 6–20)
CO2: 23 mmol/L (ref 22–32)
Calcium: 8.9 mg/dL (ref 8.9–10.3)
Chloride: 106 mmol/L (ref 98–111)
Creatinine, Ser: 0.57 mg/dL (ref 0.44–1.00)
GFR, Estimated: >60 mL/min (ref >60)
Glucose, Bld: 148 mg/dL — ABNORMAL HIGH (ref 70–99)
Potassium: 3.5 mmol/L (ref 3.5–5.1)
Sodium: 138 mmol/L (ref 135–145)
Total Bilirubin: 0.4 mg/dL (ref 0.3–1.2)
Total Protein: 6.4 g/dL — ABNORMAL LOW (ref 6.5–8.1)

## 2021-04-20 LAB — CBC
HCT: 43.8 % (ref 36.0–46.0)
Hemoglobin: 14.4 g/dL (ref 12.0–15.0)
MCH: 29.6 pg (ref 26.0–34.0)
MCHC: 32.9 g/dL (ref 30.0–36.0)
MCV: 90.1 fL (ref 80.0–100.0)
Platelets: 230 10*3/uL (ref 150–400)
RBC: 4.86 MIL/uL (ref 3.87–5.11)
RDW: 13.2 % (ref 11.5–15.5)
WBC: 15.6 10*3/uL — ABNORMAL HIGH (ref 4.0–10.5)
nRBC: 0 % (ref 0.0–0.2)

## 2021-04-20 LAB — HIV ANTIBODY (ROUTINE TESTING W REFLEX): HIV Screen 4th Generation wRfx: NONREACTIVE

## 2021-04-20 LAB — GLUCOSE, CAPILLARY
Glucose-Capillary: 109 mg/dL — ABNORMAL HIGH (ref 70–99)
Glucose-Capillary: 110 mg/dL — ABNORMAL HIGH (ref 70–99)
Glucose-Capillary: 150 mg/dL — ABNORMAL HIGH (ref 70–99)
Glucose-Capillary: 151 mg/dL — ABNORMAL HIGH (ref 70–99)
Glucose-Capillary: 179 mg/dL — ABNORMAL HIGH (ref 70–99)
Glucose-Capillary: 192 mg/dL — ABNORMAL HIGH (ref 70–99)

## 2021-04-20 LAB — PROTIME-INR
INR: 1 (ref 0.8–1.2)
Prothrombin Time: 13.2 seconds (ref 11.4–15.2)

## 2021-04-20 LAB — MAGNESIUM: Magnesium: 1.7 mg/dL (ref 1.7–2.4)

## 2021-04-20 MED ORDER — LORATADINE 10 MG PO TABS
10.0000 mg | ORAL_TABLET | Freq: Every day | ORAL | Status: DC
  Administered 2021-04-20 – 2021-04-21 (×2): 10 mg via ORAL
  Filled 2021-04-20 (×2): qty 1

## 2021-04-20 MED ORDER — PANTOPRAZOLE SODIUM 40 MG PO TBEC
40.0000 mg | DELAYED_RELEASE_TABLET | Freq: Two times a day (BID) | ORAL | Status: DC
  Administered 2021-04-20 – 2021-04-21 (×2): 40 mg via ORAL
  Filled 2021-04-20 (×2): qty 1

## 2021-04-20 MED ORDER — SODIUM CHLORIDE 0.9 % IV SOLN: INTRAVENOUS | Status: AC

## 2021-04-20 MED ORDER — PROGESTERONE MICRONIZED 100 MG PO CAPS
100.0000 mg | ORAL_CAPSULE | ORAL | Status: DC
  Filled 2021-04-20: qty 1

## 2021-04-20 MED ORDER — ONDANSETRON HCL 4 MG/2ML IJ SOLN
4.0000 mg | Freq: Four times a day (QID) | INTRAMUSCULAR | Status: DC | PRN
  Administered 2021-04-20: 4 mg via INTRAVENOUS
  Filled 2021-04-20: qty 2

## 2021-04-20 MED ORDER — LIDOCAINE VISCOUS HCL 2 % MT SOLN
15.0000 mL | Freq: Once | OROMUCOSAL | Status: AC
  Administered 2021-04-20: 15 mL via ORAL
  Filled 2021-04-20: qty 15

## 2021-04-20 MED ORDER — FINASTERIDE 5 MG PO TABS
5.0000 mg | ORAL_TABLET | ORAL | Status: DC
  Administered 2021-04-20: 5 mg via ORAL
  Filled 2021-04-20: qty 1

## 2021-04-20 MED ORDER — ALUM & MAG HYDROXIDE-SIMETH 200-200-20 MG/5ML PO SUSP
30.0000 mL | Freq: Four times a day (QID) | ORAL | Status: DC | PRN
  Administered 2021-04-20 – 2021-04-21 (×2): 30 mL via ORAL
  Filled 2021-04-20 (×2): qty 30

## 2021-04-20 MED ORDER — ENOXAPARIN SODIUM 40 MG/0.4ML IJ SOSY: 40.0000 mg | PREFILLED_SYRINGE | INTRAMUSCULAR | Status: DC

## 2021-04-20 MED ORDER — KETOROLAC TROMETHAMINE 30 MG/ML IJ SOLN
30.0000 mg | Freq: Once | INTRAMUSCULAR | Status: AC
  Administered 2021-04-20: 30 mg via INTRAVENOUS
  Filled 2021-04-20: qty 1

## 2021-04-20 MED ORDER — ENOXAPARIN SODIUM 40 MG/0.4ML IJ SOSY
40.0000 mg | PREFILLED_SYRINGE | INTRAMUSCULAR | Status: DC
  Administered 2021-04-20: 40 mg via SUBCUTANEOUS
  Filled 2021-04-20: qty 0.4

## 2021-04-20 MED ORDER — SODIUM CHLORIDE 0.9 % IV SOLN
12.5000 mg | Freq: Once | INTRAVENOUS | Status: AC
  Administered 2021-04-20: 12.5 mg via INTRAVENOUS
  Filled 2021-04-20: qty 12.5

## 2021-04-20 MED ORDER — KETOROLAC TROMETHAMINE 30 MG/ML IJ SOLN: 30.0000 mg | Freq: Four times a day (QID) | INTRAMUSCULAR | Status: DC | PRN

## 2021-04-20 MED ORDER — METOCLOPRAMIDE HCL 5 MG/ML IJ SOLN
5.0000 mg | Freq: Four times a day (QID) | INTRAMUSCULAR | Status: AC
  Administered 2021-04-20 – 2021-04-21 (×4): 5 mg via INTRAVENOUS
  Filled 2021-04-20 (×4): qty 2

## 2021-04-20 MED ORDER — ALUM & MAG HYDROXIDE-SIMETH 200-200-20 MG/5ML PO SUSP
30.0000 mL | Freq: Once | ORAL | Status: AC
  Administered 2021-04-20: 30 mL via ORAL
  Filled 2021-04-20: qty 30

## 2021-04-20 MED ORDER — POTASSIUM CHLORIDE 10 MEQ/100ML IV SOLN
10.0000 meq | INTRAVENOUS | Status: AC
  Administered 2021-04-20 (×4): 10 meq via INTRAVENOUS
  Filled 2021-04-20: qty 100

## 2021-04-20 MED ORDER — LIDOCAINE VISCOUS HCL 2 % MT SOLN
15.0000 mL | Freq: Four times a day (QID) | OROMUCOSAL | Status: DC | PRN
  Filled 2021-04-20: qty 15

## 2021-04-20 NOTE — Progress Notes (Signed)
Sudden onset of severe HA. Dr Janee Morn on unit. See md orders.

## 2021-04-20 NOTE — Progress Notes (Signed)
HA easing.

## 2021-04-20 NOTE — Progress Notes (Signed)
Was called by RN that patient's husband stated patient complained of a significant headache.  Went to assess patient patient sitting on the toilet complaining of a pounding headache in the frontal region with some associated nausea.  No emesis.  Patient also stating has had 3 watery stools that are more yellow and different from her chronic stools since cholecystectomy.  Patient also complained of some blurry vision which she states has been ongoing since admission. Check a head CT.  Continue scheduled Reglan.  We will give a dose of Toradol 30 mg IV x1. Supportive care.  No charge.

## 2021-04-20 NOTE — Progress Notes (Addendum)
PROGRESS NOTE    Jacqueline Coleman  VOZ:[id number] DOB: [birth year] DOA: 04/19/2021 PCP: Tally Joe, MD    Chief Complaint  Patient presents with  . Nausea    Brief Narrative:  Patient 60 year old female history of ankylosing spondylitis, RA, lymphedema, diabetes, hyperlipidemia, low back pain, cholelithiasis with cholecystectomy last month presented with nausea vomiting.  Patient noted to also have diarrhea ongoing since cholecystectomy. -Patient noted to have recently started on antibiotic for Augmentin for cellulitis of her nose with symptoms starting 2 days after initiation of antibiotics. -Patient seen in the ED, CBC done noted to have a leukocytosis white count of 17.3, lipase negative, respiratory viral panel and COVID PCR negative, CT abdomen and pelvis showed a normal loop of small bowel lateral to the descending colon concerning for internal hernia and possible developing SBO.  NG tube initially placed and subsequently removed after evaluation by general surgery.   Assessment & Plan:   Principal Problem:   Internal hernia Active Problems:   Abdominal pain   Type 2 diabetes mellitus with other specified complication (HCC)   Elevated blood-pressure reading, without diagnosis of hypertension   Gastroparesis   Intractable nausea and vomiting   Cellulitis   #1??  Developing SBO/internal hernia -Patient had presented with nausea vomiting. -Noncontrast CT done on presentation in the ED concerning for possible internal hernia with possible developing SBO. -General surgery consulted initially NG tube was recommended however after per general surgery no dilated small bowel no evidence of obstruction noted, no stranding in the mesentery, question of internal hernia secondary to small bowel being located to the left of the colon on the scan and per general surgery symptoms not consistent with an internal hernia or bowel obstruction given diarrhea. -General surgery recommended  removal of NG tube which was done and patient kept NPO. -Repeat abdominal films done this morning with no acute abnormalities, negative for obstruction. -Patient noted with bilateral emesis around 7 AM this morning. -Patient being followed by general surgery who are recommending initiation of clear liquids once nausea has resolved, ambulation, continuation of antibiotics for nasal cellulitis. -We will place patient on scheduled IV Reglan for the next 24 hours. -Replete electrolytes to keep magnesium approx 2, potassium approximately 4. -Follow.  2.  Nausea and vomiting -Initially felt secondary to problem #1 versus secondary to initiation of oral antibiotics. -See #1. -Patient with bout of emesis this morning at 7 AM.  Patient with complaints of indigestion. -GI cocktail x1. -Place on scheduled IV Reglan for the next 24 hours. -Allow ice chips and once nausea has resolved trial of clear liquids either this evening or in the morning for breakfast. -IV fluids. -Supportive care.  3.  Nasal cellulitis/leukocytosis -Patient recently diagnosed with cellulitis of the nose. -Patient has been started on Augmentin and noted to have symptoms of nausea and emesis 2 days after initiation of antibiotics. -Patient with a leukocytosis on presentation with a white count of 17 from 8.2 (04/11/2021). -Leukocytosis slowly trending down. -Continue Parikh IV Rocephin.  4.  Diabetes mellitus type 1.5 -Per patient on insulin pump at home. -??  Gastroparesis however patient states has not had a gastric emptying study done yet as on initial work-up abdominal ultrasound which was done was concerning for cholelithiasis and patient status post laparoscopic cholecystectomy a month ago. -Hemoglobin A1c 7.4 (03/19/2021) -CBG 151 this morning -Insulin pump discontinued during this hospitalization. -Continue SSI.  5.  Elevated blood pressure/hypertension -Patient noted not to be on a typical antihypertensive regimen  due to multiple allergies and intolerances. -Continue spironolactone, minoxidil.  6.  Hyperlipidemia -Continue statin.  7.  Ankylosing spondylitis/rheumatoid arthritis -Continue Arava -Outpatient follow-up.    DVT prophylaxis: Heparin>>>> Lovenox Code Status: Full Family Communication: Updated patient and husband at bedside. Disposition:   Status is: Observation    Dispo: The patient is from: Home              Anticipated d/c is to: Home              Patient currently : With nausea, emesis, currently n.p.o. not stable for discharge.   Difficult to place patient: No       Consultants:   General surgery: Dr. Magnus Ivan 04/19/2021  Procedures:   CT abdomen and pelvis 04/19/2021  Chest x-ray 04/20/2021  Plain films of the abdomen 04/20/2021  Antimicrobials:   IV Rocephin 04/19/2021 >>>   Subjective: Patient sitting up in bed.  Stated had some emesis overnight last episode of emesis around 7 AM.  Some complaints of nausea.  Some complaints of significant heartburn.  Denies any chest pain.  No shortness of breath.  Feels symptoms may have been secondary to recent antibiotics.  Patient states has been having ongoing diarrhea which is unchanged since cholecystectomy  Objective: Vitals:   04/19/21 2045 04/20/21 0102 04/20/21 0512 04/20/21 0940  BP: (!) 174/64 (!) 151/66 (!) 149/50 111/77  Pulse: 61 63 65 66  Resp: 17 18 18 18   Temp: 99.1 F (37.3 C) 98.7 F (37.1 C) 98.7 F (37.1 C) 98.2 F (36.8 C)  TempSrc: Oral Oral Oral Oral  SpO2: 97% 100% 97% 98%    Intake/Output Summary (Last 24 hours) at 04/20/2021 1055 Last data filed at 04/20/2021 1000 Gross per 24 hour  Intake 1080.75 ml  Output 1550 ml  Net -469.25 ml   There were no vitals filed for this visit.  Examination:  General exam: Appears calm and comfortable.  Tip of the nose with some erythema and some tenderness to palpation. Respiratory system: Clear to auscultation bilaterally.  No wheezes, no  crackles, no rhonchi. Respiratory effort normal. Cardiovascular system: S1 & S2 heard, RRR. No JVD, murmurs, rubs, gallops or clicks. No pedal edema. Gastrointestinal system: Abdomen is nondistended, soft and some tenderness to palpation around periumbilical site.  Positive bowel sounds.  No rebound.  No guarding.  Central nervous system: Alert and oriented. No focal neurological deficits. Extremities: Symmetric 5 x 5 power. Skin: No rashes, lesions or ulcers Psychiatry: Judgement and insight appear normal. Mood & affect appropriate.     Data Reviewed: I have personally reviewed following labs and imaging studies  CBC: Recent Labs  Lab 04/19/21 1504 04/20/21 0535  WBC 17.3* 15.6*  NEUTROABS 15.0*  --   HGB 16.7* 14.4  HCT 51.5* 43.8  MCV 90.8 90.1  PLT 256 230    Basic Metabolic Panel: Recent Labs  Lab 04/19/21 1504 04/20/21 0535  NA 138 138  K 4.5 3.5  CL 106 106  CO2 20* 23  GLUCOSE 275* 148*  BUN 11 10  CREATININE 0.64 0.57  CALCIUM 9.8 8.9  MG  --  1.7    GFR: Estimated Creatinine Clearance: 81.9 mL/min (by C-G formula based on SCr of 0.57 mg/dL).  Liver Function Tests: Recent Labs  Lab 04/19/21 1504 04/20/21 0535  AST 23 14*  ALT 26 20  ALKPHOS 77 61  BILITOT 0.2* 0.4  PROT 7.8 6.4*  ALBUMIN 4.3 3.4*    CBG: Recent Labs  Lab  04/19/21 1607 04/19/21 2142 04/20/21 0058 04/20/21 0428 04/20/21 0800  GLUCAP 239* 269* 179* 150* 151*     Recent Results (from the past 240 hour(s))  Resp Panel by RT-PCR (Flu A&B, Covid) Nasopharyngeal Swab     Status: None   Collection Time: 04/19/21 10:45 AM   Specimen: Nasopharyngeal Swab; Nasopharyngeal(NP) swabs in vial transport medium  Result Value Ref Range Status   SARS Coronavirus 2 by RT PCR NEGATIVE NEGATIVE Final    Comment: (NOTE) SARS-CoV-2 target nucleic acids are NOT DETECTED.  The SARS-CoV-2 RNA is generally detectable in upper respiratory specimens during the acute phase of infection. The  lowest concentration of SARS-CoV-2 viral copies this assay can detect is 138 copies/mL. A negative result does not preclude SARS-Cov-2 infection and should not be used as the sole basis for treatment or other patient management decisions. A negative result may occur with  improper specimen collection/handling, submission of specimen other than nasopharyngeal swab, presence of viral mutation(s) within the areas targeted by this assay, and inadequate number of viral copies(<138 copies/mL). A negative result must be combined with clinical observations, patient history, and epidemiological information. The expected result is Negative.  Fact Sheet for Patients:  BloggerCourse.com  Fact Sheet for Healthcare Providers:  SeriousBroker.it  This test is no t yet approved or cleared by the [place] FDA and  has been authorized for detection and/or diagnosis of SARS-CoV-2 by FDA under an Emergency Use Authorization (EUA). This EUA will remain  in effect (meaning this test can be used) for the duration of the COVID-19 declaration under Section 564(b)(1) of the Act, 21 U.S.C.section 360bbb-3(b)(1), unless the authorization is terminated  or revoked sooner.       Influenza A by PCR NEGATIVE NEGATIVE Final   Influenza B by PCR NEGATIVE NEGATIVE Final    Comment: (NOTE) The Xpert Xpress SARS-CoV-2/FLU/RSV plus assay is intended as an aid in the diagnosis of influenza from Nasopharyngeal swab specimens and should not be used as a sole basis for treatment. Nasal washings and aspirates are unacceptable for Xpert Xpress SARS-CoV-2/FLU/RSV testing.  Fact Sheet for Patients: BloggerCourse.com  Fact Sheet for Healthcare Providers: SeriousBroker.it  This test is not yet approved or cleared by the [place] FDA and has been authorized for detection and/or diagnosis of SARS-CoV-2 by FDA under  an Emergency Use Authorization (EUA). This EUA will remain in effect (meaning this test can be used) for the duration of the COVID-19 declaration under Section 564(b)(1) of the Act, 21 U.S.C. section 360bbb-3(b)(1), unless the authorization is terminated or revoked.  Performed at [facility], [address] [place], [place] [address]          Radiology Studies: CT ABDOMEN PELVIS WO CONTRAST  Result Date: 04/19/2021 CLINICAL DATA:  59 year old with acute RIGHT UPPER QUADRANT and RIGHT LOWER QUADRANT abdominal pain associated with nausea. Recent cholecystectomy on 03/19/2021. EXAM: CT ABDOMEN AND PELVIS WITHOUT CONTRAST TECHNIQUE: Multidetector CT imaging of the abdomen and pelvis was performed following the standard protocol without IV contrast. COMPARISON:  No prior CT.  Abdominal ultrasound 03/14/2021. FINDINGS: Lower chest: Normal heart size with a solitary calcified plaque in the LEFT circumflex coronary artery. Visualized lung bases clear. Hepatobiliary: Normal unenhanced appearance of the liver. Surgically absent gallbladder. No biliary ductal dilation. No abnormal fluid collection in the gallbladder fossa. Pancreas: Mildly atrophic without evidence of mass or peripancreatic inflammation. Spleen: Normal unenhanced appearance. Adrenals/Urinary Tract: Normal appearing adrenal glands. Focal scarring involving the mid LEFT kidney. Within  the limits of the unenhanced technique, no focal parenchymal abnormality involving either kidney otherwise. No hydronephrosis. No urinary tract calculi. Normal appearing urinary bladder. Stomach/Bowel: Small hiatal hernia. Stomach otherwise normal in appearance for degree distension. Normal-appearing small bowel. There are normal small bowel loops located lateral to the descending colon. Entire colon decompressed with a small stool burden. Sigmoid colon diverticulosis without evidence of acute diverticulitis. Mobile cecum positioned in the  RIGHT mid abdomen. Normal appendix in the RIGHT UPPER QUADRANT. Vascular/Lymphatic: Mild atherosclerosis involving the abdominal aorta without evidence of aneurysm. No pathologic lymphadenopathy. Reproductive: Normal-appearing uterus and ovaries without evidence of adnexal mass. Other: Periumbilical scarring at the laparoscopic portal site. No evidence of associated cellulitis or abscess. Musculoskeletal: Mild lower thoracic spondylosis. Facet degenerative changes at L4-5 and L5-S1. No acute findings. IMPRESSION: 1. No acute abnormalities involving the abdomen or pelvis. 2. Scarring in the LEFT mid kidney. 3. Small hiatal hernia. 4. Normal appearing small bowel loops located lateral to the descending colon raising the question of an internal hernia. Aortic Atherosclerosis (ICD10-170.0) Electronically Signed   By: Hulan Saas M.D.   On: 04/19/2021 18:04   DG Chest 2 View  Result Date: 04/20/2021 CLINICAL DATA:  Pt states some sternal chest pain this morning attributing it to "heartburn". States no history of heart or lung disease, but that she has RA that could attribute to some some lung issues. EXAM: CHEST - 2 VIEW COMPARISON:  none FINDINGS: Relatively low lung volumes with some crowding of bronchovascular structures in the lung bases. No confluent airspace disease or overt edema. Heart size and mediastinal contours are within normal limits. No effusion.  No pneumothorax. Visualized bones unremarkable. IMPRESSION: No acute cardiopulmonary disease. Electronically Signed   By: Corlis Leak M.D.   On: 04/20/2021 10:20   DG Abd 2 Views  Result Date: 04/20/2021 CLINICAL DATA:  Nausea, vomiting, diarrhea.  Heartburn. EXAM: ABDOMEN - 2 VIEW COMPARISON:  CT abdomen and pelvis on 04/19/2021 FINDINGS: Bowel gas pattern is nonobstructive. There is no free intraperitoneal air beneath the diaphragm. No evidence for organomegaly. Visualized osseous structures have a normal appearance. IMPRESSION: No evidence for  acute  abnormality. Electronically Signed   By: Norva Pavlov M.D.   On: 04/20/2021 09:30        Scheduled Meds: . alum & mag hydroxide-simeth  30 mL Oral Once   And  . lidocaine  15 mL Oral Once  . estradiol  2 mg Oral QODAY  . finasteride  5 mg Oral QODAY  . heparin  5,000 Units Subcutaneous Q8H  . insulin aspart  0-15 Units Subcutaneous Q4H  . leflunomide  20 mg Oral Daily  . loratadine  10 mg Oral Daily  . minoxidil  2.5 mg Oral Daily  . pantoprazole  40 mg Oral BID AC  . progesterone  100 mg Oral QHS  . rosuvastatin  5 mg Oral Daily  . sodium chloride flush  3 mL Intravenous Q12H  . spironolactone  100 mg Oral BID   Continuous Infusions: . sodium chloride 125 mL/hr at 04/20/21 1008  . cefTRIAXone (ROCEPHIN)  IV 2 g (04/19/21 2224)  . potassium chloride    . promethazine (PHENERGAN) injection (IM or IVPB) 12.5 mg (04/20/21 0057)     LOS: 0 days    Time spent: 40 minutes    Ramiro Harvest, MD [facility]   To contact the attending provider between 7A-7P or the covering provider during after hours 7P-7A, please log into the web site  [email or web address] and access using universal [place] password for that web site. If you do not have the password, please call the hospital operator.  04/20/2021, 10:55 AM

## 2021-04-20 NOTE — Progress Notes (Signed)
Subjective/Chief Complaint: Vomited until 7am this morning. Feels better now. Relates it to the oral augmentin she took   Objective: Vital signs in last 24 hours: Temp:  [97.6 F (36.4 C)-99.1 F (37.3 C)] 98.2 F (36.8 C) (05/14 0940) Pulse Rate:  [52-73] 66 (05/14 0940) Resp:  [8-20] 18 (05/14 0940) BP: (111-180)/(42-112) 111/77 (05/14 0940) SpO2:  [97 %-100 %] 98 % (05/14 0940)    Intake/Output from previous day: 05/13 0701 - 05/14 0700 In: 1080.8 [I.V.:830.8; IV Piggyback:250] Out: 1550 [Urine:800; Emesis/NG output:750] Intake/Output this shift: No intake/output data recorded.  General appearance: alert and cooperative Resp: clear to auscultation bilaterally Cardio: regular rate and rhythm GI: soft, mild right sided tenderness. good bs. no distension  Lab Results:  Recent Labs    04/19/21 1504 04/20/21 0535  WBC 17.3* 15.6*  HGB 16.7* 14.4  HCT 51.5* 43.8  PLT 256 230   BMET Recent Labs    04/19/21 1504 04/20/21 0535  NA 138 138  K 4.5 3.5  CL 106 106  CO2 20* 23  GLUCOSE 275* 148*  BUN 11 10  CREATININE 0.64 0.57  CALCIUM 9.8 8.9   PT/INR Recent Labs    04/20/21 0535  LABPROT 13.2  INR 1.0   ABG No results for input(s): PHART, HCO3 in the last 72 hours.  Invalid input(s): PCO2, PO2  Studies/Results: CT ABDOMEN PELVIS WO CONTRAST  Result Date: 04/19/2021 CLINICAL DATA:  60 year old with acute RIGHT UPPER QUADRANT and RIGHT LOWER QUADRANT abdominal pain associated with nausea. Recent cholecystectomy on 03/19/2021. EXAM: CT ABDOMEN AND PELVIS WITHOUT CONTRAST TECHNIQUE: Multidetector CT imaging of the abdomen and pelvis was performed following the standard protocol without IV contrast. COMPARISON:  No prior CT.  Abdominal ultrasound 03/14/2021. FINDINGS: Lower chest: Normal heart size with a solitary calcified plaque in the LEFT circumflex coronary artery. Visualized lung bases clear. Hepatobiliary: Normal unenhanced appearance of the liver.  Surgically absent gallbladder. No biliary ductal dilation. No abnormal fluid collection in the gallbladder fossa. Pancreas: Mildly atrophic without evidence of mass or peripancreatic inflammation. Spleen: Normal unenhanced appearance. Adrenals/Urinary Tract: Normal appearing adrenal glands. Focal scarring involving the mid LEFT kidney. Within the limits of the unenhanced technique, no focal parenchymal abnormality involving either kidney otherwise. No hydronephrosis. No urinary tract calculi. Normal appearing urinary bladder. Stomach/Bowel: Small hiatal hernia. Stomach otherwise normal in appearance for degree distension. Normal-appearing small bowel. There are normal small bowel loops located lateral to the descending colon. Entire colon decompressed with a small stool burden. Sigmoid colon diverticulosis without evidence of acute diverticulitis. Mobile cecum positioned in the RIGHT mid abdomen. Normal appendix in the RIGHT UPPER QUADRANT. Vascular/Lymphatic: Mild atherosclerosis involving the abdominal aorta without evidence of aneurysm. No pathologic lymphadenopathy. Reproductive: Normal-appearing uterus and ovaries without evidence of adnexal mass. Other: Periumbilical scarring at the laparoscopic portal site. No evidence of associated cellulitis or abscess. Musculoskeletal: Mild lower thoracic spondylosis. Facet degenerative changes at L4-5 and L5-S1. No acute findings. IMPRESSION: 1. No acute abnormalities involving the abdomen or pelvis. 2. Scarring in the LEFT mid kidney. 3. Small hiatal hernia. 4. Normal appearing small bowel loops located lateral to the descending colon raising the question of an internal hernia. Aortic Atherosclerosis (ICD10-170.0) Electronically Signed   By: Hulan Saas M.D.   On: 04/19/2021 18:04   DG Abd 2 Views  Result Date: 04/20/2021 CLINICAL DATA:  Nausea, vomiting, diarrhea.  Heartburn. EXAM: ABDOMEN - 2 VIEW COMPARISON:  CT abdomen and pelvis on 04/19/2021 FINDINGS:  Bowel gas  pattern is nonobstructive. There is no free intraperitoneal air beneath the diaphragm. No evidence for organomegaly. Visualized osseous structures have a normal appearance. IMPRESSION: No evidence for acute  abnormality. Electronically Signed   By: Norva Pavlov M.D.   On: 04/20/2021 09:30    Anti-infectives: Anti-infectives (From admission, onward)   Start     Dose/Rate Route Frequency Ordered Stop   04/19/21 2200  cefTRIAXone (ROCEPHIN) 2 g in sodium chloride 0.9 % 100 mL IVPB        2 g 200 mL/hr over 30 Minutes Intravenous Every 24 hours 04/19/21 1903        Assessment/Plan: s/p * No surgery found * Advance diet. Start clears once nausea resolves No sbo on abd xrays Ambulate Continue abx for nasal cellulitis  LOS: 0 days    Chevis Pretty III 04/20/2021

## 2021-04-21 DIAGNOSIS — R1033 Periumbilical pain: Secondary | ICD-10-CM | POA: Diagnosis not present

## 2021-04-21 DIAGNOSIS — D72829 Elevated white blood cell count, unspecified: Secondary | ICD-10-CM | POA: Diagnosis not present

## 2021-04-21 DIAGNOSIS — L03211 Cellulitis of face: Secondary | ICD-10-CM | POA: Diagnosis not present

## 2021-04-21 DIAGNOSIS — K458 Other specified abdominal hernia without obstruction or gangrene: Secondary | ICD-10-CM | POA: Diagnosis not present

## 2021-04-21 DIAGNOSIS — R112 Nausea with vomiting, unspecified: Secondary | ICD-10-CM | POA: Diagnosis not present

## 2021-04-21 LAB — GLUCOSE, CAPILLARY
Glucose-Capillary: 123 mg/dL — ABNORMAL HIGH (ref 70–99)
Glucose-Capillary: 126 mg/dL — ABNORMAL HIGH (ref 70–99)
Glucose-Capillary: 130 mg/dL — ABNORMAL HIGH (ref 70–99)
Glucose-Capillary: 179 mg/dL — ABNORMAL HIGH (ref 70–99)

## 2021-04-21 LAB — MAGNESIUM: Magnesium: 1.9 mg/dL (ref 1.7–2.4)

## 2021-04-21 LAB — BASIC METABOLIC PANEL
Anion gap: 7 (ref 5–15)
BUN: 7 mg/dL (ref 6–20)
CO2: 22 mmol/L (ref 22–32)
Calcium: 8.3 mg/dL — ABNORMAL LOW (ref 8.9–10.3)
Chloride: 106 mmol/L (ref 98–111)
Creatinine, Ser: 0.61 mg/dL (ref 0.44–1.00)
GFR, Estimated: >60 mL/min (ref >60)
Glucose, Bld: 128 mg/dL — ABNORMAL HIGH (ref 70–99)
Potassium: 4 mmol/L (ref 3.5–5.1)
Sodium: 135 mmol/L (ref 135–145)

## 2021-04-21 LAB — CBC WITH DIFFERENTIAL/PLATELET
Abs Immature Granulocytes: 0.07 10*3/uL (ref 0.00–0.07)
Basophils Absolute: 0 10*3/uL (ref 0.0–0.1)
Basophils Relative: 0 %
Eosinophils Absolute: 0 10*3/uL (ref 0.0–0.5)
Eosinophils Relative: 0 %
HCT: 46.4 % — ABNORMAL HIGH (ref 36.0–46.0)
Hemoglobin: 15.4 g/dL — ABNORMAL HIGH (ref 12.0–15.0)
Immature Granulocytes: 1 %
Lymphocytes Relative: 24 %
Lymphs Abs: 3 10*3/uL (ref 0.7–4.0)
MCH: 30 pg (ref 26.0–34.0)
MCHC: 33.2 g/dL (ref 30.0–36.0)
MCV: 90.3 fL (ref 80.0–100.0)
Monocytes Absolute: 1.3 10*3/uL — ABNORMAL HIGH (ref 0.1–1.0)
Monocytes Relative: 11 %
Neutro Abs: 7.9 10*3/uL — ABNORMAL HIGH (ref 1.7–7.7)
Neutrophils Relative %: 64 %
Platelets: 222 10*3/uL (ref 150–400)
RBC: 5.14 MIL/uL — ABNORMAL HIGH (ref 3.87–5.11)
RDW: 12.7 % (ref 11.5–15.5)
WBC: 12.2 10*3/uL — ABNORMAL HIGH (ref 4.0–10.5)
nRBC: 0 % (ref 0.0–0.2)

## 2021-04-21 MED ORDER — BOOST / RESOURCE BREEZE PO LIQD CUSTOM: 1.0000 | Freq: Two times a day (BID) | ORAL | Status: DC

## 2021-04-21 MED ORDER — PANTOPRAZOLE SODIUM 40 MG PO TBEC: 40.0000 mg | DELAYED_RELEASE_TABLET | Freq: Two times a day (BID) | ORAL | 1 refills | Status: AC

## 2021-04-21 MED ORDER — SODIUM CHLORIDE 0.9 % IV SOLN
2.0000 g | INTRAVENOUS | Status: DC
  Administered 2021-04-21: 2 g via INTRAVENOUS
  Filled 2021-04-21: qty 2

## 2021-04-21 MED ORDER — ADULT MULTIVITAMIN W/MINERALS CH: 1.0000 | ORAL_TABLET | Freq: Every day | ORAL | Status: DC

## 2021-04-21 MED ORDER — CEFDINIR 300 MG PO CAPS: 300.0000 mg | ORAL_CAPSULE | Freq: Two times a day (BID) | ORAL | 0 refills | Status: AC

## 2021-04-21 MED ORDER — PROSOURCE PLUS PO LIQD: 30.0000 mL | Freq: Two times a day (BID) | ORAL | Status: DC

## 2021-04-21 NOTE — Progress Notes (Incomplete)
PROGRESS NOTE    Jacqueline Coleman  NWG:[id number] DOB: 1961/01/30 DOA: 04/19/2021 PCP: Tally Joe, MD    Chief Complaint  Patient presents with  . Nausea    Brief Narrative:  Patient 60 year old female history of ankylosing spondylitis, RA, lymphedema, diabetes, hyperlipidemia, low back pain, cholelithiasis with cholecystectomy last month presented with nausea vomiting.  Patient noted to also have diarrhea ongoing since cholecystectomy. -Patient noted to have recently started on antibiotic for Augmentin for cellulitis of her nose with symptoms starting 2 days after initiation of antibiotics. -Patient seen in the ED, CBC done noted to have a leukocytosis white count of 17.3, lipase negative, respiratory viral panel and COVID PCR negative, CT abdomen and pelvis showed a normal loop of small bowel lateral to the descending colon concerning for internal hernia and possible developing SBO.  NG tube initially placed and subsequently removed after evaluation by general surgery.   Assessment & Plan:   Principal Problem:   Internal hernia Active Problems:   Abdominal pain   Type 2 diabetes mellitus with other specified complication (HCC)   Elevated blood-pressure reading, without diagnosis of hypertension   Gastroparesis   Intractable nausea and vomiting   Cellulitis   Leukocytosis   SBO (small bowel obstruction) (HCC)   #1??  Developing SBO/internal hernia -Patient had presented with nausea vomiting. -Noncontrast CT done on presentation in the ED concerning for possible internal hernia with possible developing SBO. -General surgery consulted initially NG tube was recommended however after per general surgery no dilated small bowel no evidence of obstruction noted, no stranding in the mesentery, question of internal hernia secondary to small bowel being located to the left of the colon on the scan and per general surgery symptoms not consistent with an internal hernia or bowel  obstruction given diarrhea. -General surgery recommended removal of NG tube which was done and patient kept NPO. -Repeat abdominal films done this morning with no acute abnormalities, negative for obstruction. -Patient noted with bilateral emesis around 7 AM this morning. -Patient being followed by general surgery who are recommending initiation of clear liquids once nausea has resolved, ambulation, continuation of antibiotics for nasal cellulitis. -We will place patient on scheduled IV Reglan for the next 24 hours. -Replete electrolytes to keep magnesium approx 2, potassium approximately 4. -Follow.  2.  Nausea and vomiting -Initially felt secondary to problem #1 versus secondary to initiation of oral antibiotics. -See #1. -Patient with bout of emesis this morning at 7 AM.  Patient with complaints of indigestion. -GI cocktail x1. -Place on scheduled IV Reglan for the next 24 hours. -Allow ice chips and once nausea has resolved trial of clear liquids either this evening or in the morning for breakfast. -IV fluids. -Supportive care.  3.  Nasal cellulitis/leukocytosis -Patient recently diagnosed with cellulitis of the nose. -Patient has been started on Augmentin and noted to have symptoms of nausea and emesis 2 days after initiation of antibiotics. -Patient with a leukocytosis on presentation with a white count of 17 from 8.2 (04/11/2021). -Leukocytosis slowly trending down. -Continue Parikh IV Rocephin.  4.  Diabetes mellitus type 1.5 -Per patient on insulin pump at home. -??  Gastroparesis however patient states has not had a gastric emptying study done yet as on initial work-up abdominal ultrasound which was done was concerning for cholelithiasis and patient status post laparoscopic cholecystectomy a month ago. -Hemoglobin A1c 7.4 (03/19/2021) -CBG 151 this morning -Insulin pump discontinued during this hospitalization. -Continue SSI.  5.  Elevated blood  pressure/hypertension -  Patient noted not to be on a typical antihypertensive regimen due to multiple allergies and intolerances. -Continue spironolactone, minoxidil.  6.  Hyperlipidemia -Continue statin.  7.  Ankylosing spondylitis/rheumatoid arthritis -Continue Arava -Outpatient follow-up.    DVT prophylaxis: Heparin>>>> Lovenox Code Status: Full Family Communication: Updated patient and husband at bedside. Disposition:   Status is: Observation    Dispo: The patient is from: Home              Anticipated d/c is to: Home              Patient currently : With nausea, emesis, currently n.p.o. not stable for discharge.   Difficult to place patient: No       Consultants:   General surgery: Dr. Magnus Ivan 04/19/2021  Procedures:   CT abdomen and pelvis 04/19/2021  Chest x-ray 04/20/2021  Plain films of the abdomen 04/20/2021  Antimicrobials:   IV Rocephin 04/19/2021 >>>   Subjective: Patient sitting up in bed.  Stated had some emesis overnight last episode of emesis around 7 AM.  Some complaints of nausea.  Some complaints of significant heartburn.  Denies any chest pain.  No shortness of breath.  Feels symptoms may have been secondary to recent antibiotics.  Patient states has been having ongoing diarrhea which is unchanged since cholecystectomy  Objective: Vitals:   04/20/21 0940 04/20/21 1353 04/20/21 2053 04/21/21 0554  BP: 111/77 124/65 (!) 178/61 (!) 158/62  Pulse: 66 64 (!) 56 (!) 59  Resp: Temp: 98.2 F (36.8 C) 98.7 F (37.1 C) 98.3 F (36.8 C) 98.3 F (36.8 C)  TempSrc: Oral Oral    SpO2: 98% 98% 99% 98%    Intake/Output Summary (Last 24 hours) at 04/21/2021 1343 Last data filed at 04/21/2021 1100 Gross per 24 hour  Intake 2895.38 ml  Output 0 ml  Net 2895.38 ml   There were no vitals filed for this visit.  Examination:  General exam: Appears calm and comfortable.  Tip of the nose with some erythema and some tenderness to  palpation. Respiratory system: Clear to auscultation bilaterally.  No wheezes, no crackles, no rhonchi. Respiratory effort normal. Cardiovascular system: S1 & S2 heard, RRR. No JVD, murmurs, rubs, gallops or clicks. No pedal edema. Gastrointestinal system: Abdomen is nondistended, soft and some tenderness to palpation around periumbilical site.  Positive bowel sounds.  No rebound.  No guarding.  Central nervous system: Alert and oriented. No focal neurological deficits. Extremities: Symmetric 5 x 5 power. Skin: No rashes, lesions or ulcers Psychiatry: Judgement and insight appear normal. Mood & affect appropriate.     Data Reviewed: I have personally reviewed following labs and imaging studies  CBC: Recent Labs  Lab 04/19/21 1504 04/20/21 0535 04/21/21 0416  WBC 17.3* 15.6* 12.2*  NEUTROABS 15.0*  --  7.9*  HGB 16.7* 14.4 15.4*  HCT 51.5* 43.8 46.4*  MCV 90.8 90.1 90.3  PLT 256 230 222    Basic Metabolic Panel: Recent Labs  Lab 04/19/21 1504 04/20/21 0535 04/21/21 0416  NA 138 138 135  K 4.5 3.5 4.0  CL 106 106 106  CO2 20* 23 22  GLUCOSE 275* 148* 128*  BUN CREATININE 0.64 0.57 0.61  CALCIUM 9.8 8.9 8.3*  MG  --  1.7 1.9    GFR: Estimated Creatinine Clearance: 81.9 mL/min (by C-G formula based on SCr of 0.61 mg/dL).  Liver Function Tests: Recent Labs  Lab 04/19/21 1504 04/20/21 0535  AST  23 14*  ALT 26 20  ALKPHOS 77 61  BILITOT 0.2* 0.4  PROT 7.8 6.4*  ALBUMIN 4.3 3.4*    CBG: Recent Labs  Lab 04/20/21 2051 04/21/21 0002 04/21/21 0342 04/21/21 0723 04/21/21 1135  GLUCAP 192* 179* 126* 123* 130*     Recent Results (from the past 240 hour(s))  Resp Panel by RT-PCR (Flu A&B, Covid) Nasopharyngeal Swab     Status: None   Collection Time: 04/19/21 10:45 AM   Specimen: Nasopharyngeal Swab; Nasopharyngeal(NP) swabs in vial transport medium  Result Value Ref Range Status   SARS Coronavirus 2 by RT PCR NEGATIVE NEGATIVE Final    Comment:  (NOTE) SARS-CoV-2 target nucleic acids are NOT DETECTED.  The SARS-CoV-2 RNA is generally detectable in upper respiratory specimens during the acute phase of infection. The lowest concentration of SARS-CoV-2 viral copies this assay can detect is 138 copies/mL. A negative result does not preclude SARS-Cov-2 infection and should not be used as the sole basis for treatment or other patient management decisions. A negative result may occur with  improper specimen collection/handling, submission of specimen other than nasopharyngeal swab, presence of viral mutation(s) within the areas targeted by this assay, and inadequate number of viral copies(<138 copies/mL). A negative result must be combined with clinical observations, patient history, and epidemiological information. The expected result is Negative.  Fact Sheet for Patients:  BloggerCourse.com  Fact Sheet for Healthcare Providers:  SeriousBroker.it  This test is no t yet approved or cleared by the [place] FDA and  has been authorized for detection and/or diagnosis of SARS-CoV-2 by FDA under an Emergency Use Authorization (EUA). This EUA will remain  in effect (meaning this test can be used) for the duration of the COVID-19 declaration under Section 564(b)(1) of the Act, 21 U.S.C.section 360bbb-3(b)(1), unless the authorization is terminated  or revoked sooner.       Influenza A by PCR NEGATIVE NEGATIVE Final   Influenza B by PCR NEGATIVE NEGATIVE Final    Comment: (NOTE) The Xpert Xpress SARS-CoV-2/FLU/RSV plus assay is intended as an aid in the diagnosis of influenza from Nasopharyngeal swab specimens and should not be used as a sole basis for treatment. Nasal washings and aspirates are unacceptable for Xpert Xpress SARS-CoV-2/FLU/RSV testing.  Fact Sheet for Patients: BloggerCourse.com  Fact Sheet for Healthcare  Providers: SeriousBroker.it  This test is not yet approved or cleared by the [place] FDA and has been authorized for detection and/or diagnosis of SARS-CoV-2 by FDA under an Emergency Use Authorization (EUA). This EUA will remain in effect (meaning this test can be used) for the duration of the COVID-19 declaration under Section 564(b)(1) of the Act, 21 U.S.C. section 360bbb-3(b)(1), unless the authorization is terminated or revoked.  Performed at [facility], [address] [place], [place] [address]          Radiology Studies: CT ABDOMEN PELVIS WO CONTRAST  Result Date: 04/19/2021 CLINICAL DATA:  60 year old with acute RIGHT UPPER QUADRANT and RIGHT LOWER QUADRANT abdominal pain associated with nausea. Recent cholecystectomy on 03/19/2021. EXAM: CT ABDOMEN AND PELVIS WITHOUT CONTRAST TECHNIQUE: Multidetector CT imaging of the abdomen and pelvis was performed following the standard protocol without IV contrast. COMPARISON:  No prior CT.  Abdominal ultrasound 03/14/2021. FINDINGS: Lower chest: Normal heart size with a solitary calcified plaque in the LEFT circumflex coronary artery. Visualized lung bases clear. Hepatobiliary: Normal unenhanced appearance of the liver. Surgically absent gallbladder. No biliary ductal dilation. No abnormal fluid collection in the  gallbladder fossa. Pancreas: Mildly atrophic without evidence of mass or peripancreatic inflammation. Spleen: Normal unenhanced appearance. Adrenals/Urinary Tract: Normal appearing adrenal glands. Focal scarring involving the mid LEFT kidney. Within the limits of the unenhanced technique, no focal parenchymal abnormality involving either kidney otherwise. No hydronephrosis. No urinary tract calculi. Normal appearing urinary bladder. Stomach/Bowel: Small hiatal hernia. Stomach otherwise normal in appearance for degree distension. Normal-appearing small bowel. There are normal small  bowel loops located lateral to the descending colon. Entire colon decompressed with a small stool burden. Sigmoid colon diverticulosis without evidence of acute diverticulitis. Mobile cecum positioned in the RIGHT mid abdomen. Normal appendix in the RIGHT UPPER QUADRANT. Vascular/Lymphatic: Mild atherosclerosis involving the abdominal aorta without evidence of aneurysm. No pathologic lymphadenopathy. Reproductive: Normal-appearing uterus and ovaries without evidence of adnexal mass. Other: Periumbilical scarring at the laparoscopic portal site. No evidence of associated cellulitis or abscess. Musculoskeletal: Mild lower thoracic spondylosis. Facet degenerative changes at L4-5 and L5-S1. No acute findings. IMPRESSION: 1. No acute abnormalities involving the abdomen or pelvis. 2. Scarring in the LEFT mid kidney. 3. Small hiatal hernia. 4. Normal appearing small bowel loops located lateral to the descending colon raising the question of an internal hernia. Aortic Atherosclerosis (ICD10-170.0) Electronically Signed   By: Hulan Saas M.D.   On: 04/19/2021 18:04   DG Chest 2 View  Result Date: 04/20/2021 CLINICAL DATA:  Pt states some sternal chest pain this morning attributing it to "heartburn". States no history of heart or lung disease, but that she has RA that could attribute to some some lung issues. EXAM: CHEST - 2 VIEW COMPARISON:  none FINDINGS: Relatively low lung volumes with some crowding of bronchovascular structures in the lung bases. No confluent airspace disease or overt edema. Heart size and mediastinal contours are within normal limits. No effusion.  No pneumothorax. Visualized bones unremarkable. IMPRESSION: No acute cardiopulmonary disease. Electronically Signed   By: Corlis Leak M.D.   On: 04/20/2021 10:20   CT HEAD WO CONTRAST  Result Date: 04/20/2021 CLINICAL DATA:  Headache with blurred vision EXAM: CT HEAD WITHOUT CONTRAST TECHNIQUE: Contiguous axial images were obtained from the base  of the skull through the vertex without intravenous contrast. COMPARISON:  None. FINDINGS: Brain: No evidence of acute infarction, hemorrhage, hydrocephalus, extra-axial collection or mass lesion/mass effect. Vascular: No hyperdense vessel or unexpected calcification. Skull: Normal. Negative for fracture or focal lesion. Sinuses/Orbits: No acute finding. Other: None. IMPRESSION: No acute intracranial pathology. Electronically Signed   By: Maudry Mayhew MD   On: 04/20/2021 20:30   DG Abd 2 Views  Result Date: 04/20/2021 CLINICAL DATA:  Nausea, vomiting, diarrhea.  Heartburn. EXAM: ABDOMEN - 2 VIEW COMPARISON:  CT abdomen and pelvis on 04/19/2021 FINDINGS: Bowel gas pattern is nonobstructive. There is no free intraperitoneal air beneath the diaphragm. No evidence for organomegaly. Visualized osseous structures have a normal appearance. IMPRESSION: No evidence for acute  abnormality. Electronically Signed   By: Norva Pavlov M.D.   On: 04/20/2021 09:30        Scheduled Meds: . enoxaparin (LOVENOX) injection  40 mg Subcutaneous Q24H  . estradiol  2 mg Oral QODAY  . finasteride  5 mg Oral QODAY  . insulin aspart  0-15 Units Subcutaneous Q4H  . leflunomide  20 mg Oral Daily  . loratadine  10 mg Oral Daily  . minoxidil  2.5 mg Oral Daily  . pantoprazole  40 mg Oral BID AC  . progesterone  100 mg Oral QODAY  . rosuvastatin  5 mg Oral Daily  . sodium chloride flush  3 mL Intravenous Q12H  . spironolactone  100 mg Oral BID   Continuous Infusions: . cefTRIAXone (ROCEPHIN)  IV 2 g (04/20/21 2254)  . promethazine (PHENERGAN) injection (IM or IVPB) 12.5 mg (04/21/21 0325)     LOS: 0 days    Time spent: 40 minutes    Ramiro Harvest, MD [facility]   To contact the attending provider between 7A-7P or the covering provider during after hours 7P-7A, please log into the web site [email or web address] and access using universal [place] password for that web site. If you do not have the  password, please call the hospital operator.  04/21/2021, 1:43 PM

## 2021-04-21 NOTE — Progress Notes (Signed)
Patient was given discharge instructions, and all questions were answered.  Patient was stable for discharge and was taken to the main exit by wheelchair. 

## 2021-04-21 NOTE — Discharge Summary (Signed)
Physician Discharge Summary  Jacqueline Coleman VQQ:[id number] DOB: December 19, 1960 DOA: 04/19/2021  PCP: Tally Joe, MD  Admit date: 04/19/2021 Discharge date: 04/21/2021  Time spent: 60 minutes  Recommendations for Outpatient Follow-up:  1. Follow-up with Dr. Dulce Sellar, gastroenterology in 1 to 2 weeks for further follow-up and work-up of nausea and emesis. 2. Follow-up with Tally Joe, MD as previously scheduled on 04/25/2021.  On follow-up patient's cellulitis of the nose will need to be followed up upon.  Patient will need a basic metabolic profile done to follow-up on electrolytes and renal function.  CBC will need to be done to follow-up on counts.  Patient's nausea and emesis will need to be followed up upon   Discharge Diagnoses:  Principal Problem:   Internal hernia Active Problems:   Abdominal pain   Type 2 diabetes mellitus with other specified complication (HCC)   Elevated blood-pressure reading, without diagnosis of hypertension   Gastroparesis   Intractable nausea and vomiting   Cellulitis   Leukocytosis   SBO (small bowel obstruction) (HCC)   Discharge Condition: Stable and improved  Diet recommendation: Heart healthy/soft diet  There were no vitals filed for this visit.  History of present illness:  HPI per Dr. Dionicia Abler is a 60 y.o. female with medical history significant of ankylosing spondylitis, rheumatoid arthritis, lymphedema, diabetes, gastroparesis, hyperlipidemia, low back pain, cholelithiasis with cholecystectomy last month who presented with nausea and vomiting.             Patient stated that she had nausea and vomiting starting the morning of admission.  She has some degree of chronic episodes of nausea and vomiting for which she typically does fluid restriction which she tried today.  She also typically has antiemetics although she was out of these. She reports that she has had some chronic diarrhea since having her cholecystectomy  last month.  Reported to the ED due to persistent symptoms.             She denies fevers, chills, chest pain, shortness of breath, abdominal pain, constipation.  Recently started on antibiotic Augmentin which she has not been on before for recent diagnosis of cellulitis on her nose.  ED Course: Vital signs in the ED significant for blood pressure in the 140s systolic.  Lab work-up showed CMP with bicarb 20, glucose 275.  CBC with leukocytosis to 17.3 and hemoglobin of 16.7.  Lipase negative.  Lactic acid pending.  Respiratory panel for flu and COVID-negative.  Urinalysis showing glucose and ketones.  CT on pelvis showed no acute abnormality but did show a normal loop of small bowel lateral to the descending colon concerning for internal hernia.  General surgery was consulted considering her recent operation and also have concern for internal hernia and possible developing SBO.  Recommend NG tube and will see the patient.   Hospital Course:  #1??  Developing SBO/internal hernia ruled out -Patient had presented with nausea vomiting. -Noncontrast CT done on presentation in the ED concerning for possible internal hernia with possible developing SBO. -General surgery consulted initially NG tube was recommended however after evaluation of imaging, per general surgery no dilated small bowel no evidence of obstruction noted, no stranding in the mesentery, question of internal hernia secondary to small bowel being located to the left of the colon on the scan and per general surgery symptoms not consistent with an internal hernia or bowel obstruction given diarrhea. -General surgery recommended removal of NG tube which was done and patient  kept NPO initially. -Repeat abdominal films done with no acute abnormalities, negative for obstruction. -Patient noted with an episode of emesis around 7 AM on 04/20/2021 which subsequently resolved.   -Patient followed by general surgery who recommended initiation of  clear liquids once nausea has resolved, ambulation, continuation of antibiotics for nasal cellulitis. -Patient was placed on scheduled IV Reglan for 24 hours which she tolerated.   -Patient's electrolytes were repleted.   -Patient diet subsequently advanced to a full liquid diet which she tolerated.   -Patient felt she had improved clinically and was close to baseline and requesting to be discharged home with close outpatient follow-up with GI and her PCP.   -Patient was discharged home in stable and improved condition with close outpatient follow-up with PCP and GI, Dr. Dulce Sellar.   2.  Nausea and vomiting -Likely secondary to oral antibiotics prior to admission.   -Initially felt secondary to problem #1 versus secondary to initiation of oral antibiotics. -See #1. -Patient was admitted noted to have a bout of emesis initially and subsequently complains of indigestion. -Patient was placed on GI cocktail as needed as well as scheduled IV Reglan for 24 hours. -Patient improved clinically subsequently started on clear liquid diet which she tolerated and diet advanced to full liquid diet which she tolerated. -Patient improved clinically and wanted to be discharged home as she felt much better. -Patient was discharged home on a soft diet and states has close follow-up with PCP and GI. -Patient stated was being evaluated in the outpatient setting for nausea vomiting by GI. -Outpatient follow-up with GI.  3.  Nasal cellulitis/leukocytosis -Patient recently diagnosed with cellulitis of the nose. -Patient had been started on Augmentin and noted to have symptoms of nausea and emesis 2 days after initiation of antibiotics. -Patient with a leukocytosis on presentation with a white count of 17 from 8.2 (04/11/2021). -Patient placed on IV Rocephin which she tolerated.   -Leukocytosis trended down.   -Patient improved clinically and be discharged home on 2 more days of oral Omnicef to complete a 5-day course  of antibiotic treatment.   -Outpatient follow-up with PCP.   4.  Diabetes mellitus type 1.5 -Per patient on insulin pump at home. -??  Gastroparesis however patient states has NOT had a gastric emptying study done yet as on initial work-up abdominal ultrasound which was done was concerning for cholelithiasis and patient status post laparoscopic cholecystectomy a month ago. -Hemoglobin A1c 7.4 (03/19/2021) -Patient's insulin pump was discontinued during the hospitalization and patient maintained on sliding scale insulin.   -Blood glucose levels remain controlled during the hospitalization.   -Outpatient follow-up with PCP.  5.  Elevated blood pressure/hypertension -Patient noted not to be on a typical antihypertensive regimen due to multiple allergies and intolerances. -Patient maintained on home regimen of spironolactone, minoxidil.  6.  Hyperlipidemia -Patient maintained on home regimen statin.   7.  Ankylosing spondylitis/rheumatoid arthritis -Continued on home regimen of Arava -Outpatient follow-up.  Procedures:  CT abdomen and pelvis 04/19/2021  Chest x-ray 04/20/2021  Plain films of the abdomen 04/20/2021  CT head 04/20/2021  Consultations:  General surgery: Dr. Magnus Ivan 04/19/2021    Discharge Exam: Vitals:   04/21/21 0554 04/21/21 1343  BP: (!) 158/62 135/69  Pulse: (!) 59 69  Resp: 14 18  Temp: 98.3 F (36.8 C) 98.7 F (37.1 C)  SpO2: 98% 98%    General: NAD Cardiovascular: RRR Respiratory: CTAB  Discharge Instructions   Discharge Instructions    Diet - low  sodium heart healthy   Complete by: As directed    Increase activity slowly   Complete by: As directed      Allergies as of 04/21/2021      Reactions   Methotrexate Other (See Comments)   Put pt in DKA   Adhesive [tape] Other (See Comments)   Blister - Brown stretching bandaid   Codeine Itching   Farxiga [dapagliflozin]    Hydrocodone Itching   Lisinopril Other (See Comments)   Back  pain   Losartan Other (See Comments)   Back pain   Metformin Hcl    Other reaction(s): Nausea   Prednisone Other (See Comments)   Adverse reaction       Medication List    STOP taking these medications   amoxicillin-clavulanate 875-125 MG tablet Commonly known as: AUGMENTIN   omeprazole 20 MG capsule Commonly known as: PRILOSEC Replaced by: pantoprazole 40 MG tablet     TAKE these medications   acetaminophen 500 MG tablet Commonly known as: TYLENOL Take 2 tablets (1,000 mg total) by mouth every 6 (six) hours as needed.   Biotin 1000 MCG tablet Take 1,000 mcg by mouth daily.   cefdinir 300 MG capsule Commonly known as: OMNICEF Take 1 capsule (300 mg total) by mouth 2 (two) times daily for 2 days. Start taking on: Apr 22, 2021   cetirizine 10 MG tablet Commonly known as: ZYRTEC Take 10 mg by mouth daily.   Dexcom G6 Sensor Misc SMARTSIG:1 Each Topical Every 10 Days   Dexcom G6 Transmitter Misc   estradiol 2 MG tablet Commonly known as: ESTRACE Take 2 mg by mouth every other day.   finasteride 5 MG tablet Commonly known as: PROSCAR Take 5 mg by mouth every other day.   ibuprofen 200 MG tablet Commonly known as: Motrin IB Take 3 tablets (600 mg total) by mouth every 8 (eight) hours as needed. What changed: reasons to take this   insulin lispro 100 UNIT/ML injection Commonly known as: HUMALOG Inject 62 Units into the skin daily. Pump - 2 units per hour. Max bolus - 15 units. Daily Max - 62 units   ketoconazole 2 % shampoo Commonly known as: NIZORAL Apply 1 application topically See admin instructions. Three  times a week   leflunomide 10 MG tablet Commonly known as: ARAVA Take 20 mg by mouth daily.   minoxidil 2.5 MG tablet Commonly known as: LONITEN Take 0.625 mg by mouth daily.   ondansetron 4 MG tablet Commonly known as: ZOFRAN Take 1 tablet (4 mg total) by mouth every 6 (six) hours.   pantoprazole 40 MG tablet Commonly known as:  PROTONIX Take 1 tablet (40 mg total) by mouth 2 (two) times daily before a meal. Replaces: omeprazole 20 MG capsule   progesterone 100 MG capsule Commonly known as: PROMETRIUM Take 100 mg by mouth every other day.   rosuvastatin 5 MG tablet Commonly known as: CRESTOR Take 5 mg by mouth See admin instructions. Takes 1 tablet three times a week   Simponi Aria 50 MG/4ML Soln injection Generic drug: golimumab Inject 12.5 mg into the vein every 8 (eight) weeks.   spironolactone 100 MG tablet Commonly known as: ALDACTONE Take 100 mg by mouth 2 (two) times daily.      Allergies  Allergen Reactions  . Methotrexate Other (See Comments)    Put pt in DKA  . Adhesive [Tape] Other (See Comments)    Blister - Brown stretching bandaid    . Codeine Itching  .  Farxiga [Dapagliflozin]   . Hydrocodone Itching  . Lisinopril Other (See Comments)    Back pain  . Losartan Other (See Comments)    Back pain   . Metformin Hcl     Other reaction(s): Nausea  . Prednisone Other (See Comments)    Adverse reaction     Follow-up Information    Tally Joe, MD Follow up on 04/25/2021.   Specialty: Family Medicine Why: f/u as scheduled Contact information: [address] [place] [address] [phone]        Willis Modena, MD Follow up in 1 week(s).   Specialty: Gastroenterology Why: f/u in 1-2 weeks Contact information: [address] [place] [address] [phone]                The results of significant diagnostics from this hospitalization (including imaging, microbiology, ancillary and laboratory) are listed below for reference.    Significant Diagnostic Studies: CT ABDOMEN PELVIS WO CONTRAST  Result Date: 04/19/2021 CLINICAL DATA:  60 year old with acute RIGHT UPPER QUADRANT and RIGHT LOWER QUADRANT abdominal pain associated with nausea. Recent cholecystectomy on 03/19/2021. EXAM: CT ABDOMEN AND PELVIS WITHOUT CONTRAST TECHNIQUE:  Multidetector CT imaging of the abdomen and pelvis was performed following the standard protocol without IV contrast. COMPARISON:  No prior CT.  Abdominal ultrasound 03/14/2021. FINDINGS: Lower chest: Normal heart size with a solitary calcified plaque in the LEFT circumflex coronary artery. Visualized lung bases clear. Hepatobiliary: Normal unenhanced appearance of the liver. Surgically absent gallbladder. No biliary ductal dilation. No abnormal fluid collection in the gallbladder fossa. Pancreas: Mildly atrophic without evidence of mass or peripancreatic inflammation. Spleen: Normal unenhanced appearance. Adrenals/Urinary Tract: Normal appearing adrenal glands. Focal scarring involving the mid LEFT kidney. Within the limits of the unenhanced technique, no focal parenchymal abnormality involving either kidney otherwise. No hydronephrosis. No urinary tract calculi. Normal appearing urinary bladder. Stomach/Bowel: Small hiatal hernia. Stomach otherwise normal in appearance for degree distension. Normal-appearing small bowel. There are normal small bowel loops located lateral to the descending colon. Entire colon decompressed with a small stool burden. Sigmoid colon diverticulosis without evidence of acute diverticulitis. Mobile cecum positioned in the RIGHT mid abdomen. Normal appendix in the RIGHT UPPER QUADRANT. Vascular/Lymphatic: Mild atherosclerosis involving the abdominal aorta without evidence of aneurysm. No pathologic lymphadenopathy. Reproductive: Normal-appearing uterus and ovaries without evidence of adnexal mass. Other: Periumbilical scarring at the laparoscopic portal site. No evidence of associated cellulitis or abscess. Musculoskeletal: Mild lower thoracic spondylosis. Facet degenerative changes at L4-5 and L5-S1. No acute findings. IMPRESSION: 1. No acute abnormalities involving the abdomen or pelvis. 2. Scarring in the LEFT mid kidney. 3. Small hiatal hernia. 4. Normal appearing small bowel loops  located lateral to the descending colon raising the question of an internal hernia. Aortic Atherosclerosis (ICD10-170.0) Electronically Signed   By: Hulan Saas M.D.   On: 04/19/2021 18:04   DG Chest 2 View  Result Date: 04/20/2021 CLINICAL DATA:  Pt states some sternal chest pain this morning attributing it to "heartburn". States no history of heart or lung disease, but that she has RA that could attribute to some some lung issues. EXAM: CHEST - 2 VIEW COMPARISON:  none FINDINGS: Relatively low lung volumes with some crowding of bronchovascular structures in the lung bases. No confluent airspace disease or overt edema. Heart size and mediastinal contours are within normal limits. No effusion.  No pneumothorax. Visualized bones unremarkable. IMPRESSION: No acute cardiopulmonary disease. Electronically Signed   By: Algis Downs  Deanne Coffer M.D.   On: 04/20/2021 10:20   CT HEAD WO CONTRAST  Result Date: 04/20/2021 CLINICAL DATA:  Headache with blurred vision EXAM: CT HEAD WITHOUT CONTRAST TECHNIQUE: Contiguous axial images were obtained from the base of the skull through the vertex without intravenous contrast. COMPARISON:  None. FINDINGS: Brain: No evidence of acute infarction, hemorrhage, hydrocephalus, extra-axial collection or mass lesion/mass effect. Vascular: No hyperdense vessel or unexpected calcification. Skull: Normal. Negative for fracture or focal lesion. Sinuses/Orbits: No acute finding. Other: None. IMPRESSION: No acute intracranial pathology. Electronically Signed   By: Maudry Mayhew MD   On: 04/20/2021 20:30   MR SACRUM SI JOINTS W WO CONTRAST  Result Date: 03/29/2021 CLINICAL DATA:  Low back and SI joint pain for 2 years. No acute injury or prior relevant surgery. EXAM: MRI SACRUM WITHOUT CONTRAST TECHNIQUE: Multiplanar multi-sequence MR imaging of the sacrum was performed. No intravenous contrast was administered. COMPARISON:  Lumbar MRI 11/19/2019. Report only from pelvic CT 07/15/2002 and  outside radiographs 08/01/2019-unavailable. FINDINGS: Bones/Joint/Cartilage The sacrum and sacroiliac joints appear normal. There are no erosive changes or abnormal synovial enhancement. There are prominent facet degenerative changes in the lower lumbar spine with associated synovial enhancement, especially at L4-5. The grade 1 anterolisthesis and mild left-sided lateral recess and foraminal narrowing at that level appears unchanged from previous MRI. Ligaments Not relevant for exam/indication. Muscles and Tendons Unremarkable. Soft tissues Possible mild rectosigmoid colonic wall thickening with mucosal hyperenhancement. Probable postsurgical changes anteriorly in the lower uterine segment with a cervical ring. IMPRESSION: 1. The sacroiliac joints appear normal. 2. Prominent lumbar facet arthropathy with synovial enhancement, especially at L4-5. This may contribute to low back pain. No nerve root encroachment identified. 3. Rectosigmoid colon wall thickening and mucosal hyperenhancement suggesting chronic colitis. Correlate clinically. Electronically Signed   By: Carey Bullocks M.D.   On: 03/29/2021 13:09   DG Abd 2 Views  Result Date: 04/20/2021 CLINICAL DATA:  Nausea, vomiting, diarrhea.  Heartburn. EXAM: ABDOMEN - 2 VIEW COMPARISON:  CT abdomen and pelvis on 04/19/2021 FINDINGS: Bowel gas pattern is nonobstructive. There is no free intraperitoneal air beneath the diaphragm. No evidence for organomegaly. Visualized osseous structures have a normal appearance. IMPRESSION: No evidence for acute  abnormality. Electronically Signed   By: Norva Pavlov M.D.   On: 04/20/2021 09:30    Microbiology: Recent Results (from the past 240 hour(s))  Resp Panel by RT-PCR (Flu A&B, Covid) Nasopharyngeal Swab     Status: None   Collection Time: 04/19/21 10:45 AM   Specimen: Nasopharyngeal Swab; Nasopharyngeal(NP) swabs in vial transport medium  Result Value Ref Range Status   SARS Coronavirus 2 by RT PCR NEGATIVE  NEGATIVE Final    Comment: (NOTE) SARS-CoV-2 target nucleic acids are NOT DETECTED.  The SARS-CoV-2 RNA is generally detectable in upper respiratory specimens during the acute phase of infection. The lowest concentration of SARS-CoV-2 viral copies this assay can detect is 138 copies/mL. A negative result does not preclude SARS-Cov-2 infection and should not be used as the sole basis for treatment or other patient management decisions. A negative result may occur with  improper specimen collection/handling, submission of specimen other than nasopharyngeal swab, presence of viral mutation(s) within the areas targeted by this assay, and inadequate number of viral copies(<138 copies/mL). A negative result must be combined with clinical observations, patient history, and epidemiological information. The expected result is Negative.  Fact Sheet for Patients:  BloggerCourse.com  Fact Sheet for Healthcare Providers:  SeriousBroker.it  This test is  no t yet approved or cleared by the [place] and  has been authorized for detection and/or diagnosis of SARS-CoV-2 by FDA under an Emergency Use Authorization (EUA). This EUA will remain  in effect (meaning this test can be used) for the duration of the COVID-19 declaration under Section 564(b)(1) of the Act, 21 U.S.C.section 360bbb-3(b)(1), unless the authorization is terminated  or revoked sooner.       Influenza A by PCR NEGATIVE NEGATIVE Final   Influenza B by PCR NEGATIVE NEGATIVE Final    Comment: (NOTE) The Xpert Xpress SARS-CoV-2/FLU/RSV plus assay is intended as an aid in the diagnosis of influenza from Nasopharyngeal swab specimens and should not be used as a sole basis for treatment. Nasal washings and aspirates are unacceptable for Xpert Xpress SARS-CoV-2/FLU/RSV testing.  Fact Sheet for Patients: BloggerCourse.com  Fact Sheet for Healthcare  Providers: SeriousBroker.it  This test is not yet approved or cleared by the [place] FDA and has been authorized for detection and/or diagnosis of SARS-CoV-2 by FDA under an Emergency Use Authorization (EUA). This EUA will remain in effect (meaning this test can be used) for the duration of the COVID-19 declaration under Section 564(b)(1) of the Act, 21 U.S.C. section 360bbb-3(b)(1), unless the authorization is terminated or revoked.  Performed at [facility], [address], [place], [place] [address]      Labs: Basic Metabolic Panel: Recent Labs  Lab 04/19/21 1504 04/20/21 0535 04/21/21 0416  NA 138 138 135  K 4.5 3.5 4.0  CL 106 106 106  CO2 20* 23 22  GLUCOSE 275* 148* 128*  BUN 11 10 7   CREATININE 0.64 0.57 0.61  CALCIUM 9.8 8.9 8.3*  MG  --  1.7 1.9   Liver Function Tests: Recent Labs  Lab 04/19/21 1504 04/20/21 0535  AST 23 14*  ALT 26 20  ALKPHOS 77 61  BILITOT 0.2* 0.4  PROT 7.8 6.4*  ALBUMIN 4.3 3.4*   Recent Labs  Lab 04/19/21 1504  LIPASE 26   No results for input(s): AMMONIA in the last 168 hours. CBC: Recent Labs  Lab 04/19/21 1504 04/20/21 0535 04/21/21 0416  WBC 17.3* 15.6* 12.2*  NEUTROABS 15.0*  --  7.9*  HGB 16.7* 14.4 15.4*  HCT 51.5* 43.8 46.4*  MCV 90.8 90.1 90.3  PLT 256 230 222   Cardiac Enzymes: No results for input(s): CKTOTAL, CKMB, CKMBINDEX, TROPONINI in the last 168 hours. BNP: BNP (last 3 results) No results for input(s): BNP in the last 8760 hours.  ProBNP (last 3 results) No results for input(s): PROBNP in the last 8760 hours.  CBG: Recent Labs  Lab 04/20/21 2051 04/21/21 0002 04/21/21 0342 04/21/21 0723 04/21/21 1135  GLUCAP 192* 179* 126* 123* 130*       Signed:  04/23/21 MD.  [facility] 04/21/2021, 2:15 PM

## 2021-04-21 NOTE — Progress Notes (Signed)
Subjective/Chief Complaint: Had episode of vomiting yesterday with headache. Seems to be better now   Objective: Vital signs in last 24 hours: Temp:  [98.2 F (36.8 C)-98.7 F (37.1 C)] 98.3 F (36.8 C) (05/15 0554) Pulse Rate:  [56-66] 59 (05/15 0554) Resp:  [14-18] 14 (05/15 0554) BP: (111-178)/(61-77) 158/62 (05/15 0554) SpO2:  [98 %-99 %] 98 % (05/15 0554) Last BM Date: 04/20/21  Intake/Output from previous day: 05/14 0701 - 05/15 0700 In: 3075.4 [P.O.:180; I.V.:2395.4; IV Piggyback:500] Out: 0  Intake/Output this shift: Total I/O In: 100 [P.O.:100] Out: -   General appearance: alert and cooperative Resp: clear to auscultation bilaterally Cardio: regular rate and rhythm GI: soft, nontender. not distended  Lab Results:  Recent Labs    04/20/21 0535 04/21/21 0416  WBC 15.6* 12.2*  HGB 14.4 15.4*  HCT 43.8 46.4*  PLT 230 222   BMET Recent Labs    04/20/21 0535 04/21/21 0416  NA 138 135  K 3.5 4.0  CL 106 106  CO2 23 22  GLUCOSE 148* 128*  BUN 10 7  CREATININE 0.57 0.61  CALCIUM 8.9 8.3*   PT/INR Recent Labs    04/20/21 0535  LABPROT 13.2  INR 1.0   ABG No results for input(s): PHART, HCO3 in the last 72 hours.  Invalid input(s): PCO2, PO2  Studies/Results: CT ABDOMEN PELVIS WO CONTRAST  Result Date: 04/19/2021 CLINICAL DATA:  60 year old with acute RIGHT UPPER QUADRANT and RIGHT LOWER QUADRANT abdominal pain associated with nausea. Recent cholecystectomy on 03/19/2021. EXAM: CT ABDOMEN AND PELVIS WITHOUT CONTRAST TECHNIQUE: Multidetector CT imaging of the abdomen and pelvis was performed following the standard protocol without IV contrast. COMPARISON:  No prior CT.  Abdominal ultrasound 03/14/2021. FINDINGS: Lower chest: Normal heart size with a solitary calcified plaque in the LEFT circumflex coronary artery. Visualized lung bases clear. Hepatobiliary: Normal unenhanced appearance of the liver. Surgically absent gallbladder. No biliary  ductal dilation. No abnormal fluid collection in the gallbladder fossa. Pancreas: Mildly atrophic without evidence of mass or peripancreatic inflammation. Spleen: Normal unenhanced appearance. Adrenals/Urinary Tract: Normal appearing adrenal glands. Focal scarring involving the mid LEFT kidney. Within the limits of the unenhanced technique, no focal parenchymal abnormality involving either kidney otherwise. No hydronephrosis. No urinary tract calculi. Normal appearing urinary bladder. Stomach/Bowel: Small hiatal hernia. Stomach otherwise normal in appearance for degree distension. Normal-appearing small bowel. There are normal small bowel loops located lateral to the descending colon. Entire colon decompressed with a small stool burden. Sigmoid colon diverticulosis without evidence of acute diverticulitis. Mobile cecum positioned in the RIGHT mid abdomen. Normal appendix in the RIGHT UPPER QUADRANT. Vascular/Lymphatic: Mild atherosclerosis involving the abdominal aorta without evidence of aneurysm. No pathologic lymphadenopathy. Reproductive: Normal-appearing uterus and ovaries without evidence of adnexal mass. Other: Periumbilical scarring at the laparoscopic portal site. No evidence of associated cellulitis or abscess. Musculoskeletal: Mild lower thoracic spondylosis. Facet degenerative changes at L4-5 and L5-S1. No acute findings. IMPRESSION: 1. No acute abnormalities involving the abdomen or pelvis. 2. Scarring in the LEFT mid kidney. 3. Small hiatal hernia. 4. Normal appearing small bowel loops located lateral to the descending colon raising the question of an internal hernia. Aortic Atherosclerosis (ICD10-170.0) Electronically Signed   By: Hulan Saas M.D.   On: 04/19/2021 18:04   DG Chest 2 View  Result Date: 04/20/2021 CLINICAL DATA:  Pt states some sternal chest pain this morning attributing it to "heartburn". States no history of heart or lung disease, but that she has RA that  could attribute to  some some lung issues. EXAM: CHEST - 2 VIEW COMPARISON:  none FINDINGS: Relatively low lung volumes with some crowding of bronchovascular structures in the lung bases. No confluent airspace disease or overt edema. Heart size and mediastinal contours are within normal limits. No effusion.  No pneumothorax. Visualized bones unremarkable. IMPRESSION: No acute cardiopulmonary disease. Electronically Signed   By: Corlis Leak M.D.   On: 04/20/2021 10:20   CT HEAD WO CONTRAST  Result Date: 04/20/2021 CLINICAL DATA:  Headache with blurred vision EXAM: CT HEAD WITHOUT CONTRAST TECHNIQUE: Contiguous axial images were obtained from the base of the skull through the vertex without intravenous contrast. COMPARISON:  None. FINDINGS: Brain: No evidence of acute infarction, hemorrhage, hydrocephalus, extra-axial collection or mass lesion/mass effect. Vascular: No hyperdense vessel or unexpected calcification. Skull: Normal. Negative for fracture or focal lesion. Sinuses/Orbits: No acute finding. Other: None. IMPRESSION: No acute intracranial pathology. Electronically Signed   By: Maudry Mayhew MD   On: 04/20/2021 20:30   DG Abd 2 Views  Result Date: 04/20/2021 CLINICAL DATA:  Nausea, vomiting, diarrhea.  Heartburn. EXAM: ABDOMEN - 2 VIEW COMPARISON:  CT abdomen and pelvis on 04/19/2021 FINDINGS: Bowel gas pattern is nonobstructive. There is no free intraperitoneal air beneath the diaphragm. No evidence for organomegaly. Visualized osseous structures have a normal appearance. IMPRESSION: No evidence for acute  abnormality. Electronically Signed   By: Norva Pavlov M.D.   On: 04/20/2021 09:30    Anti-infectives: Anti-infectives (From admission, onward)   Start     Dose/Rate Route Frequency Ordered Stop   04/19/21 2200  cefTRIAXone (ROCEPHIN) 2 g in sodium chloride 0.9 % 100 mL IVPB        2 g 200 mL/hr over 30 Minutes Intravenous Every 24 hours 04/19/21 1903        Assessment/Plan: s/p * No surgery found  * Advance diet. Allow fulls today May need GI to see again. They have been following her for this nausea and vomiting for the last year  LOS: 0 days    Chevis Pretty III 04/21/2021

## 2021-04-21 NOTE — Progress Notes (Signed)
Initial Nutrition Assessment  RD working remotely.  DOCUMENTATION CODES:   Not applicable  INTERVENTION:  - will order Boost Breeze BID, each supplement provides 250 kcal and 9 grams of protein. - will order 30 ml Prosource Plus BID, each supplement provides 100 kcal and 15 grams protein.  - will order 1 tablet multivitamin with minerals/day. - diet advancement as medically feasible. - weigh patient today.  - complete NFPE when feasible.    NUTRITION DIAGNOSIS:   Inadequate oral intake related to acute illness,diarrhea,nausea,vomiting as evidenced by per patient/family report.  GOAL:   Patient will meet greater than or equal to 90% of their needs  MONITOR:   PO intake,Supplement acceptance,Diet advancement,Labs,Weight trends  REASON FOR ASSESSMENT:   Malnutrition Screening Tool  ASSESSMENT:   60 y.o. female with medical history of ankylosing spondylitis, rheumatoid arthritis, lymphedema, DM, gastroparesis, HLD, chronic low back pain, and cholelithiasis s/p cholecystectomy last month. She presented to the ED d/t N/V. she reports chronic diarrhea since cholecystectomy. Of note, she was recently started on augmentin for nasal cellulitis.  Diet advanced from NPO to CLD yesterday at 1808 and to FLD today at 0817; no intakes have been documented so far.   She has not been weighed since 04/11/21 when she weighed 199.5 lb. Prior to that, she weighed 199 lb on 03/18/21. No information documented in the edema section of flow sheet.  Patient is noted to currently be out of the room to CT d/t headache with reported blurry vision. She is OBV status.   Per notes: - last episode of emesis was 5/14 - Surgery following - SBO has been r/o   Labs reviewed; Hgb A1c: 7.4% in April, CBGs: 179, 126, 123, 130 mg/dl, Ca: 8.3 mg/dl. Medications reviewed; sliding scale novolog, 40 mg oral protonix BID, 10 mEq IV KCl x4 runs 5/14, 100 mg aldactone BID.    NUTRITION - FOCUSED PHYSICAL  EXAM:  unable to complete at this time.   Diet Order:   Diet Order            Diet full liquid Room service appropriate? Yes; Fluid consistency: Thin  Diet effective now           Diet - low sodium heart healthy                 EDUCATION NEEDS:   Not appropriate for education at this time  Skin:  Skin Assessment: Reviewed RN Assessment  Last BM:  PTA/unknown  Height:   Ht Readings from Last 1 Encounters:  04/11/21 5\' 3"  (1.6 m)    Weight:   Wt Readings from Last 1 Encounters:  04/11/21 90.7 kg    Estimated Nutritional Needs:  Kcal:  1815-2015 kcal Protein:  90-100 grams Fluid:  >/= 2.2 L/day      06/11/21, MS, RD, LDN, CNSC Inpatient Clinical Dietitian RD pager # available in [facility]  After hours/weekend pager # available in [facility]

## 2021-04-25 DIAGNOSIS — E782 Mixed hyperlipidemia: Secondary | ICD-10-CM | POA: Diagnosis not present

## 2021-04-25 DIAGNOSIS — J340 Abscess, furuncle and carbuncle of nose: Secondary | ICD-10-CM | POA: Diagnosis not present

## 2021-04-25 DIAGNOSIS — R609 Edema, unspecified: Secondary | ICD-10-CM | POA: Diagnosis not present

## 2021-05-01 ENCOUNTER — Other Ambulatory Visit: Payer: Self-pay | Admitting: *Deleted

## 2021-05-01 DIAGNOSIS — D751 Secondary polycythemia: Secondary | ICD-10-CM

## 2021-05-14 DIAGNOSIS — J340 Abscess, furuncle and carbuncle of nose: Secondary | ICD-10-CM | POA: Diagnosis not present

## 2021-05-21 DIAGNOSIS — E1169 Type 2 diabetes mellitus with other specified complication: Secondary | ICD-10-CM | POA: Diagnosis not present

## 2021-05-21 DIAGNOSIS — Z794 Long term (current) use of insulin: Secondary | ICD-10-CM | POA: Diagnosis not present

## 2021-05-27 DIAGNOSIS — M459 Ankylosing spondylitis of unspecified sites in spine: Secondary | ICD-10-CM | POA: Diagnosis not present

## 2021-05-27 DIAGNOSIS — M5136 Other intervertebral disc degeneration, lumbar region: Secondary | ICD-10-CM | POA: Diagnosis not present

## 2021-05-27 DIAGNOSIS — Z6834 Body mass index (BMI) 34.0-34.9, adult: Secondary | ICD-10-CM | POA: Diagnosis not present

## 2021-05-27 DIAGNOSIS — M47816 Spondylosis without myelopathy or radiculopathy, lumbar region: Secondary | ICD-10-CM | POA: Diagnosis not present

## 2021-05-31 DIAGNOSIS — K219 Gastro-esophageal reflux disease without esophagitis: Secondary | ICD-10-CM | POA: Diagnosis not present

## 2021-05-31 DIAGNOSIS — Z86010 Personal history of colonic polyps: Secondary | ICD-10-CM | POA: Diagnosis not present

## 2021-05-31 DIAGNOSIS — Z9049 Acquired absence of other specified parts of digestive tract: Secondary | ICD-10-CM | POA: Diagnosis not present

## 2021-05-31 DIAGNOSIS — R1110 Vomiting, unspecified: Secondary | ICD-10-CM | POA: Diagnosis not present

## 2021-06-04 ENCOUNTER — Encounter (HOSPITAL_BASED_OUTPATIENT_CLINIC_OR_DEPARTMENT_OTHER): Payer: BC Managed Care – PPO | Admitting: Internal Medicine

## 2021-06-05 DIAGNOSIS — M25531 Pain in right wrist: Secondary | ICD-10-CM | POA: Diagnosis not present

## 2021-06-05 DIAGNOSIS — M0579 Rheumatoid arthritis with rheumatoid factor of multiple sites without organ or systems involvement: Secondary | ICD-10-CM | POA: Diagnosis not present

## 2021-06-05 DIAGNOSIS — Z79899 Other long term (current) drug therapy: Secondary | ICD-10-CM | POA: Diagnosis not present

## 2021-06-05 DIAGNOSIS — M79643 Pain in unspecified hand: Secondary | ICD-10-CM | POA: Diagnosis not present

## 2021-06-18 DIAGNOSIS — M461 Sacroiliitis, not elsewhere classified: Secondary | ICD-10-CM | POA: Diagnosis not present

## 2021-06-18 DIAGNOSIS — R030 Elevated blood-pressure reading, without diagnosis of hypertension: Secondary | ICD-10-CM | POA: Diagnosis not present

## 2021-06-18 DIAGNOSIS — M5136 Other intervertebral disc degeneration, lumbar region: Secondary | ICD-10-CM | POA: Diagnosis not present

## 2021-06-18 DIAGNOSIS — M47816 Spondylosis without myelopathy or radiculopathy, lumbar region: Secondary | ICD-10-CM | POA: Diagnosis not present

## 2021-06-25 DIAGNOSIS — M0579 Rheumatoid arthritis with rheumatoid factor of multiple sites without organ or systems involvement: Secondary | ICD-10-CM | POA: Diagnosis not present

## 2021-07-09 DIAGNOSIS — M461 Sacroiliitis, not elsewhere classified: Secondary | ICD-10-CM | POA: Diagnosis not present

## 2021-07-16 ENCOUNTER — Ambulatory Visit (HOSPITAL_BASED_OUTPATIENT_CLINIC_OR_DEPARTMENT_OTHER): Payer: BC Managed Care – PPO | Attending: Hematology and Oncology | Admitting: Internal Medicine

## 2021-07-16 ENCOUNTER — Other Ambulatory Visit: Payer: Self-pay

## 2021-07-16 DIAGNOSIS — D751 Secondary polycythemia: Secondary | ICD-10-CM | POA: Diagnosis not present

## 2021-07-16 DIAGNOSIS — G4733 Obstructive sleep apnea (adult) (pediatric): Secondary | ICD-10-CM | POA: Diagnosis not present

## 2021-07-22 ENCOUNTER — Ambulatory Visit (HOSPITAL_COMMUNITY): Payer: BC Managed Care – PPO

## 2021-07-27 DIAGNOSIS — D751 Secondary polycythemia: Secondary | ICD-10-CM

## 2021-07-27 NOTE — Procedures (Signed)
   Patient Name: Jacqueline Coleman, Jacqueline Coleman Date: 07/16/2021 Gender: Female D.O.B: 1960/12/25 Age (years): 63 Referring Provider: Jeanie Sewer MD Height (inches): 63 Interpreting Physician: Jetty Duhamel MD, ABSM Weight (lbs): 199 RPSGT: [place] BMI: 35 MRN: [id number] Neck Size: 15.00  CLINICAL INFORMATION Sleep Study Type: HST Indication for sleep study: OSA Epworth Sleepiness Score: 5  SLEEP STUDY TECHNIQUE A multi-channel overnight portable sleep study was performed. The channels recorded were: nasal airflow, thoracic respiratory movement, and oxygen saturation with a pulse oximetry. Snoring was also monitored.  MEDICATIONS Patient self administered medications include: none reported.  SLEEP ARCHITECTURE Patient was studied for 440.4 minutes. The sleep efficiency was 100.0 % and the patient was supine for 0%. The arousal index was 0.0 per hour.  RESPIRATORY PARAMETERS The overall AHI was 16.6 per hour, with a central apnea index of 0 per hour. The oxygen nadir was 85% during sleep.  CARDIAC DATA Mean heart rate during sleep was 57.6 bpm.  IMPRESSIONS - Moderate obstructive sleep apnea occurred during this study (AHI = 16.6/h). - Moderate oxygen desaturation was noted during this study (Min O2 = 85%). Mean O2 saaturation 93% - Patient snored.  DIAGNOSIS - Obstructive Sleep Apnea (G47.33)  RECOMMENDATIONS - Suggest CPAP titration sleep study or autopap. Other options would be based on clinical judgment. - Be careful with  alcohol, sedatives and other CNS depressants that may worsen sleep apnea and disrupt normal sleep architecture. - Sleep hygiene should be reviewed to assess factors that may improve sleep quality. - Weight management and regular exercise should be initiated or continued.  [Electronically signed] 07/27/2021 11:23 AM  Jetty Duhamel MD, ABSM Diplomate, American Board of Sleep Medicine   NPI: [id number]                           Jetty Duhamel Diplomate, American Board of Sleep Medicine  ELECTRONICALLY SIGNED ON:  07/27/2021, 11:15 AM  [facility] PH: [phone]   FX: [phone] ACCREDITED BY THE AMERICAN ACADEMY OF SLEEP MEDICINE

## 2021-07-30 DIAGNOSIS — E785 Hyperlipidemia, unspecified: Secondary | ICD-10-CM | POA: Diagnosis not present

## 2021-07-30 DIAGNOSIS — K3184 Gastroparesis: Secondary | ICD-10-CM | POA: Diagnosis not present

## 2021-07-30 DIAGNOSIS — E1169 Type 2 diabetes mellitus with other specified complication: Secondary | ICD-10-CM | POA: Diagnosis not present

## 2021-07-30 DIAGNOSIS — Z9641 Presence of insulin pump (external) (internal): Secondary | ICD-10-CM | POA: Diagnosis not present

## 2021-07-31 ENCOUNTER — Telehealth: Payer: Self-pay | Admitting: *Deleted

## 2021-07-31 ENCOUNTER — Other Ambulatory Visit: Payer: Self-pay

## 2021-07-31 ENCOUNTER — Ambulatory Visit (HOSPITAL_COMMUNITY)
Admission: RE | Admit: 2021-07-31 | Discharge: 2021-07-31 | Disposition: A | Discharge status: Home / Self Care | Payer: BC Managed Care – PPO | Source: Ambulatory Visit | Attending: Physician Assistant | Admitting: Physician Assistant

## 2021-07-31 DIAGNOSIS — R112 Nausea with vomiting, unspecified: Secondary | ICD-10-CM | POA: Diagnosis not present

## 2021-07-31 DIAGNOSIS — R1110 Vomiting, unspecified: Secondary | ICD-10-CM

## 2021-07-31 MED ORDER — TECHNETIUM TC 99M SULFUR COLLOID
2.0000 | Freq: Once | INTRAVENOUS | Status: AC | PRN
  Administered 2021-07-31: 2 via INTRAVENOUS

## 2021-07-31 NOTE — Telephone Encounter (Signed)
Encourage her to follow up with sleep medicine regarding their recommendations for CPAP and further testing. Recommend smoking cessation. We will see her back in Nov 2022 to assure it is improving her blood counts.

## 2021-07-31 NOTE — Telephone Encounter (Signed)
Received call from pt. She has mhad the sleep study done and the results are available. She is asking [place] what her next steps are.  Please advise

## 2021-08-01 ENCOUNTER — Telehealth: Payer: Self-pay | Admitting: *Deleted

## 2021-08-01 NOTE — Telephone Encounter (Signed)
Call made to pt regarding results of sleep study. Spoke with pt and advised that Dr. Leonides Schanz does recommend the CPAP and to stop smoking. Advised that the sleep study department is the one to order the CPAP.  Also advised that we will see her back here in 3 months. Pt voiced understanding

## 2021-08-08 DIAGNOSIS — H00021 Hordeolum internum right upper eyelid: Secondary | ICD-10-CM | POA: Diagnosis not present

## 2021-08-20 DIAGNOSIS — M0579 Rheumatoid arthritis with rheumatoid factor of multiple sites without organ or systems involvement: Secondary | ICD-10-CM | POA: Diagnosis not present

## 2021-08-26 ENCOUNTER — Emergency Department (HOSPITAL_COMMUNITY)
Admission: EM | Admit: 2021-08-26 | Discharge: 2021-08-26 | Disposition: A | Discharge status: Home / Self Care | Payer: BC Managed Care – PPO | Attending: Emergency Medicine | Admitting: Emergency Medicine

## 2021-08-26 ENCOUNTER — Encounter (HOSPITAL_COMMUNITY): Payer: Self-pay | Admitting: *Deleted

## 2021-08-26 DIAGNOSIS — R001 Bradycardia, unspecified: Secondary | ICD-10-CM | POA: Diagnosis not present

## 2021-08-26 DIAGNOSIS — Z5321 Procedure and treatment not carried out due to patient leaving prior to being seen by health care provider: Secondary | ICD-10-CM | POA: Insufficient documentation

## 2021-08-26 DIAGNOSIS — R112 Nausea with vomiting, unspecified: Secondary | ICD-10-CM | POA: Insufficient documentation

## 2021-08-26 DIAGNOSIS — E860 Dehydration: Secondary | ICD-10-CM | POA: Diagnosis not present

## 2021-08-26 DIAGNOSIS — I4891 Unspecified atrial fibrillation: Secondary | ICD-10-CM | POA: Diagnosis not present

## 2021-08-26 DIAGNOSIS — I10 Essential (primary) hypertension: Secondary | ICD-10-CM | POA: Diagnosis not present

## 2021-08-26 NOTE — ED Triage Notes (Signed)
Per EMS, pt complains of nausea/vomiting since receiving infusion for RA last week. Pt received 4mg  zofran, 400cc NS en route.

## 2021-08-26 NOTE — ED Notes (Addendum)
Pt stated upon last set of vitals that she was not going to sit in the lobby any longer to be seen   She stated that she had been sitting for 5 hours with no lab work and  she is a type 1 diabetic with a blood sugar of 200 and that it was at 115 also that she was in Afib upon arrival and no one did an EKG.    Pt was disturbed that blood sugar would drop and she would be in jeopardy.   Pt stated that if she would get lab work that she would have to wait another 5 hours for a doctor to tell her she is ok and the discharge her.   She stated that she felt great from the medication and bag of fluid she received from the EMS and that she wanted to leave.  Informed the triage nurse and PA of her concern.  I later informed the patient that the PA would be happy to see her and get some lab works but we would not have a room for her.  Pt wanted IV out and left.  Informed RN.

## 2021-08-27 DIAGNOSIS — R9431 Abnormal electrocardiogram [ECG] [EKG]: Secondary | ICD-10-CM | POA: Diagnosis not present

## 2021-08-27 DIAGNOSIS — R112 Nausea with vomiting, unspecified: Secondary | ICD-10-CM | POA: Diagnosis not present

## 2021-08-28 DIAGNOSIS — E1169 Type 2 diabetes mellitus with other specified complication: Secondary | ICD-10-CM | POA: Diagnosis not present

## 2021-08-28 DIAGNOSIS — Z794 Long term (current) use of insulin: Secondary | ICD-10-CM | POA: Diagnosis not present

## 2021-09-03 DIAGNOSIS — Z79899 Other long term (current) drug therapy: Secondary | ICD-10-CM | POA: Diagnosis not present

## 2021-09-03 DIAGNOSIS — M2550 Pain in unspecified joint: Secondary | ICD-10-CM | POA: Diagnosis not present

## 2021-09-03 DIAGNOSIS — M0579 Rheumatoid arthritis with rheumatoid factor of multiple sites without organ or systems involvement: Secondary | ICD-10-CM | POA: Diagnosis not present

## 2021-09-03 DIAGNOSIS — E1169 Type 2 diabetes mellitus with other specified complication: Secondary | ICD-10-CM | POA: Diagnosis not present

## 2021-09-03 DIAGNOSIS — Z794 Long term (current) use of insulin: Secondary | ICD-10-CM | POA: Diagnosis not present

## 2021-09-03 DIAGNOSIS — M79643 Pain in unspecified hand: Secondary | ICD-10-CM | POA: Diagnosis not present

## 2021-09-03 DIAGNOSIS — M25531 Pain in right wrist: Secondary | ICD-10-CM | POA: Diagnosis not present

## 2021-09-10 DIAGNOSIS — M461 Sacroiliitis, not elsewhere classified: Secondary | ICD-10-CM | POA: Diagnosis not present

## 2021-09-23 DIAGNOSIS — R0609 Other forms of dyspnea: Secondary | ICD-10-CM | POA: Diagnosis not present

## 2021-09-24 DIAGNOSIS — R0600 Dyspnea, unspecified: Secondary | ICD-10-CM | POA: Diagnosis not present

## 2021-09-24 DIAGNOSIS — F1290 Cannabis use, unspecified, uncomplicated: Secondary | ICD-10-CM | POA: Diagnosis not present

## 2021-09-24 DIAGNOSIS — I499 Cardiac arrhythmia, unspecified: Secondary | ICD-10-CM | POA: Diagnosis not present

## 2021-09-24 DIAGNOSIS — R079 Chest pain, unspecified: Secondary | ICD-10-CM | POA: Diagnosis not present

## 2021-09-24 DIAGNOSIS — R112 Nausea with vomiting, unspecified: Secondary | ICD-10-CM | POA: Diagnosis not present

## 2021-09-24 DIAGNOSIS — R0609 Other forms of dyspnea: Secondary | ICD-10-CM | POA: Diagnosis not present

## 2021-09-24 DIAGNOSIS — F17200 Nicotine dependence, unspecified, uncomplicated: Secondary | ICD-10-CM | POA: Diagnosis not present

## 2021-09-26 DIAGNOSIS — D123 Benign neoplasm of transverse colon: Secondary | ICD-10-CM | POA: Diagnosis not present

## 2021-09-26 DIAGNOSIS — K2970 Gastritis, unspecified, without bleeding: Secondary | ICD-10-CM | POA: Diagnosis not present

## 2021-09-26 DIAGNOSIS — K319 Disease of stomach and duodenum, unspecified: Secondary | ICD-10-CM | POA: Diagnosis not present

## 2021-09-26 DIAGNOSIS — K649 Unspecified hemorrhoids: Secondary | ICD-10-CM | POA: Diagnosis not present

## 2021-09-26 DIAGNOSIS — Z86010 Personal history of colonic polyps: Secondary | ICD-10-CM | POA: Diagnosis not present

## 2021-09-26 DIAGNOSIS — R112 Nausea with vomiting, unspecified: Secondary | ICD-10-CM | POA: Diagnosis not present

## 2021-09-26 IMAGING — DX DG ABDOMEN 2V
3 series · 3 of 3 positions shown · non-contrast
Comparison: CT abdomen and pelvis on 04/19/2021

CLINICAL DATA: Nausea, vomiting, diarrhea.  Heartburn.

EXAM:
ABDOMEN - 2 VIEW

[abdomen erect]
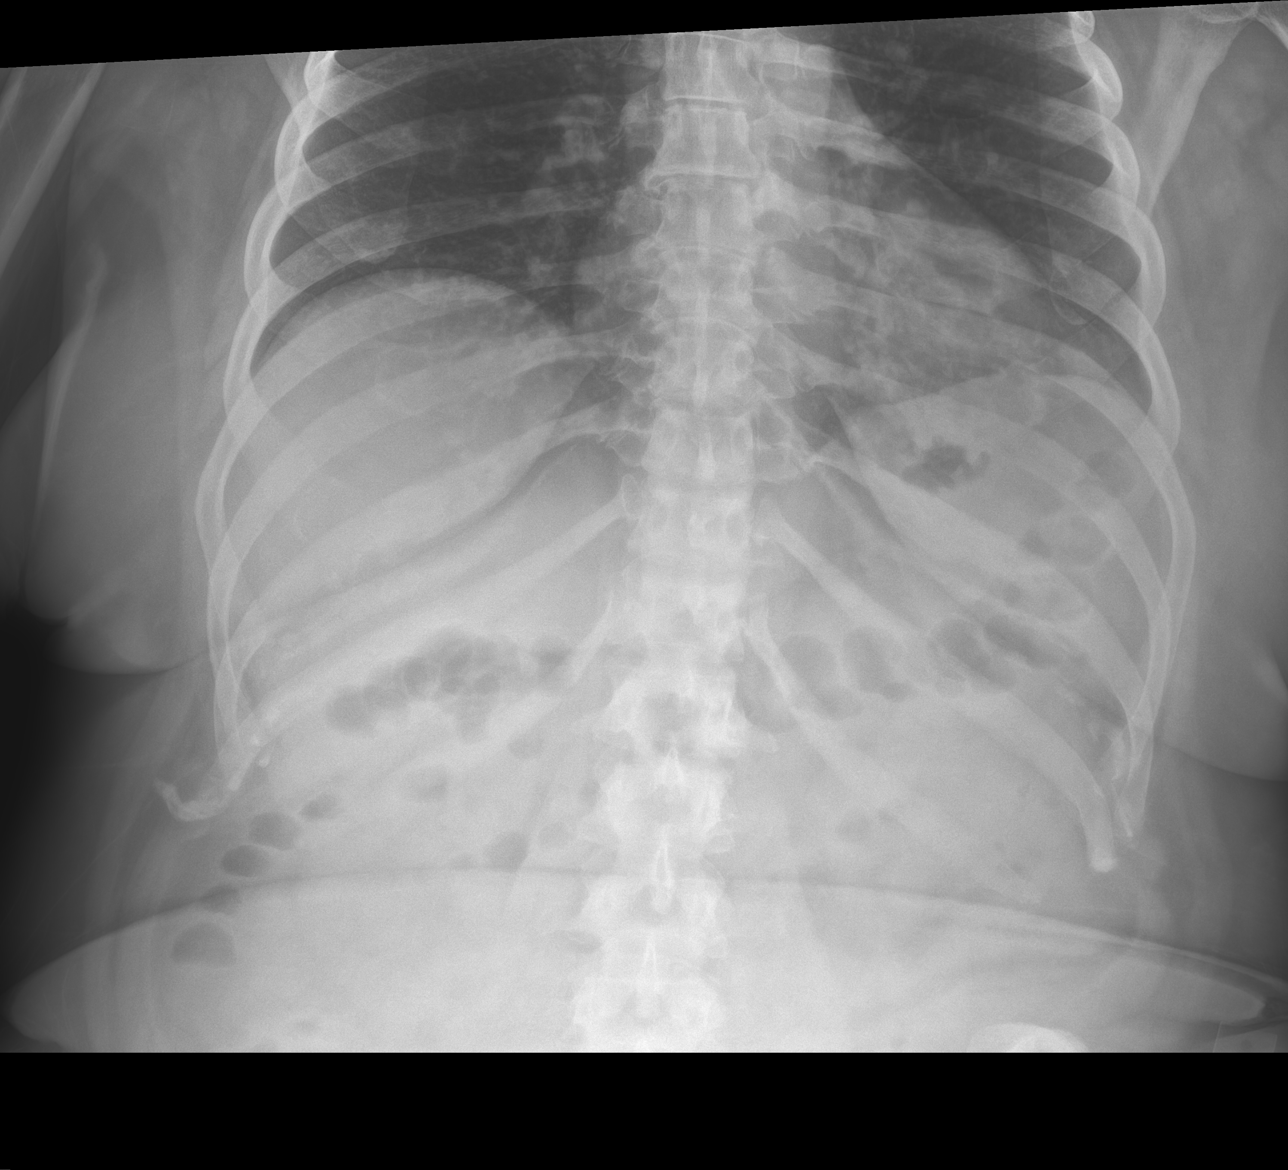

[abdomen supine (1 of 2)]
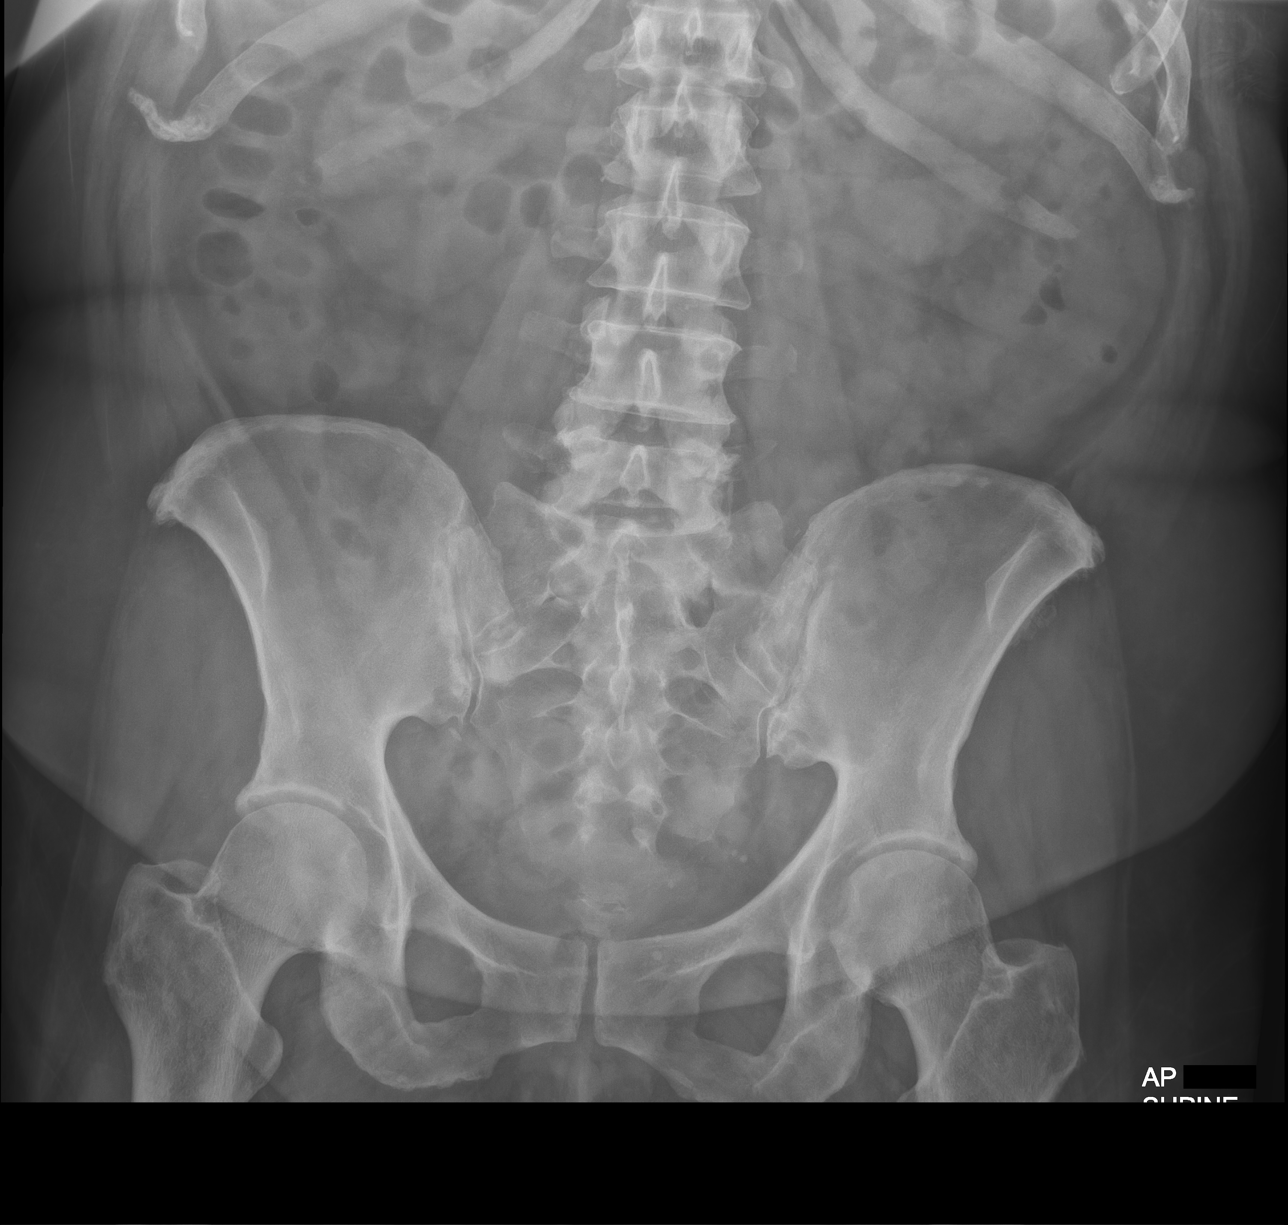

[abdomen supine (2 of 2)]
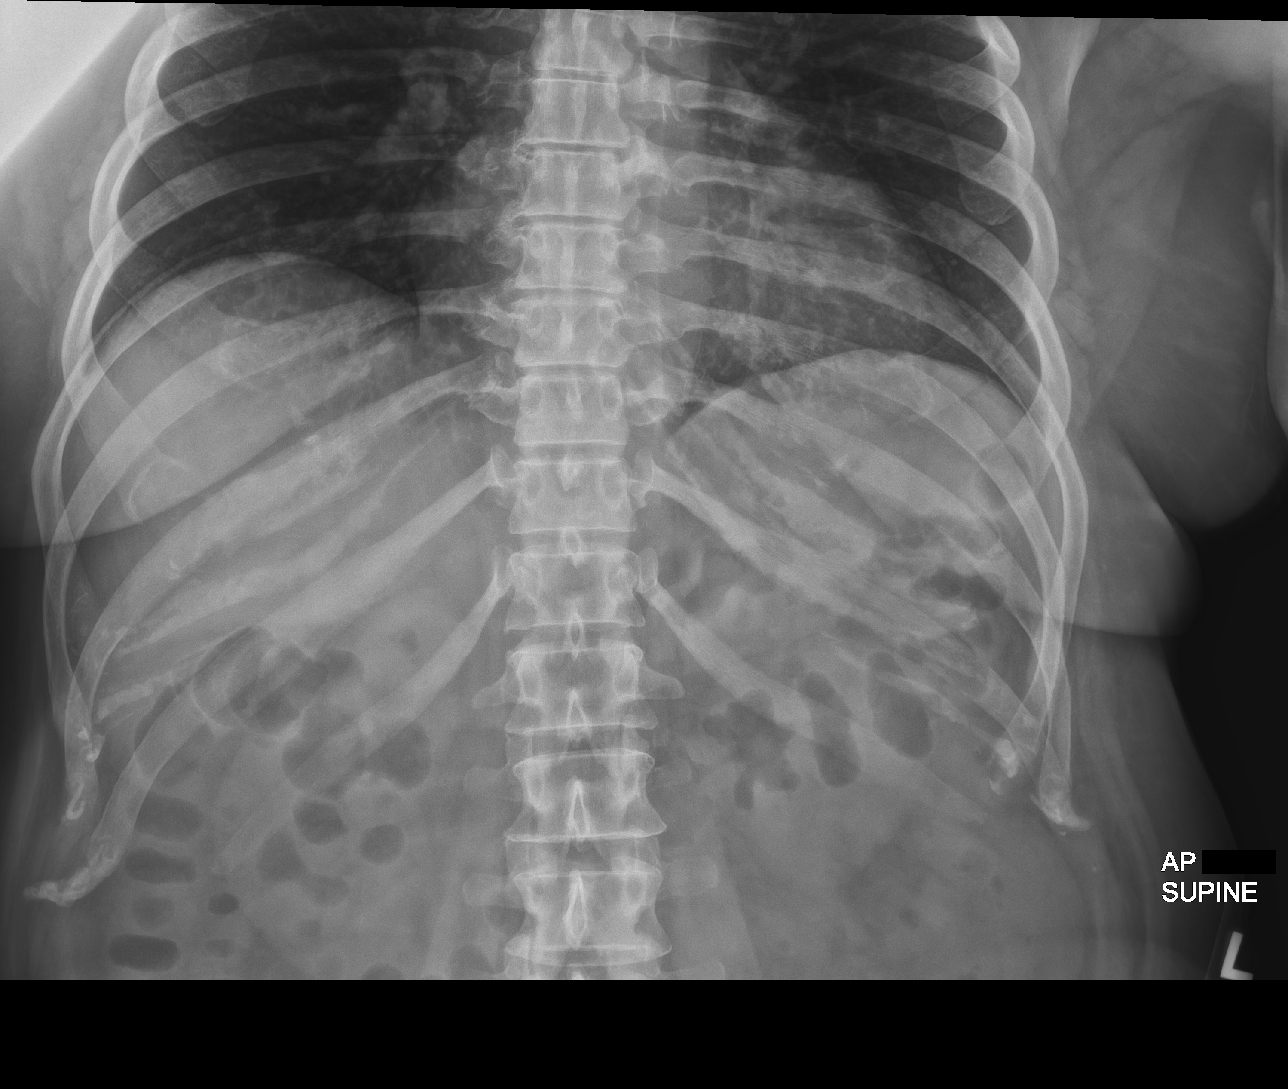

[3 of 3 positions shown; findings below may reference images not displayed]

FINDINGS: Bowel gas pattern is nonobstructive. There is no free
intraperitoneal air beneath the diaphragm. No evidence for
organomegaly. Visualized osseous structures have a normal
appearance.
IMPRESSION: No evidence for acute  abnormality.

## 2021-09-26 IMAGING — CT CT HEAD W/O CM
4 series · 17 of 47 positions shown, 19 images · non-contrast
Comparison: None.

CLINICAL DATA: Headache with blurred vision

EXAM:
CT HEAD WITHOUT CONTRAST
TECHNIQUE: Contiguous axial images were obtained from the base of the skull
through the vertex without intravenous contrast.

[Series 2: head wo · axial · 0.45mm/px · z∈[-136,-16]mm · 7 of 33 slices shown, 9 images]
[im 5/33  brain]
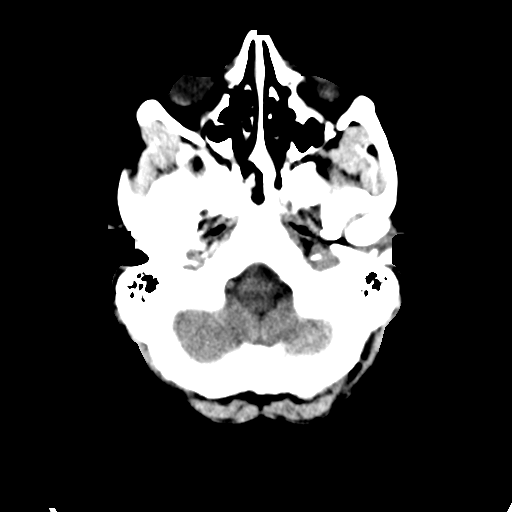
[im 5/33  bone]
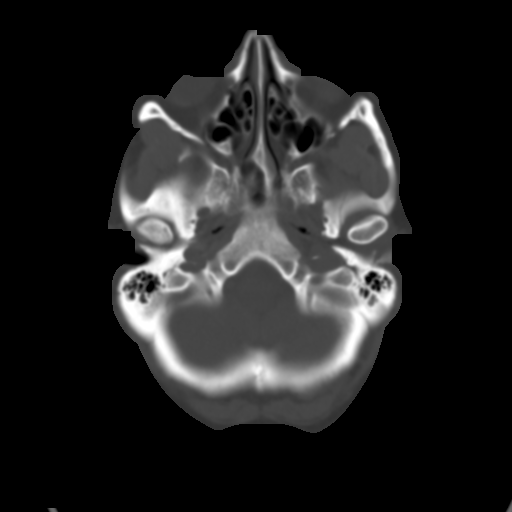
[im 9/33  brain]
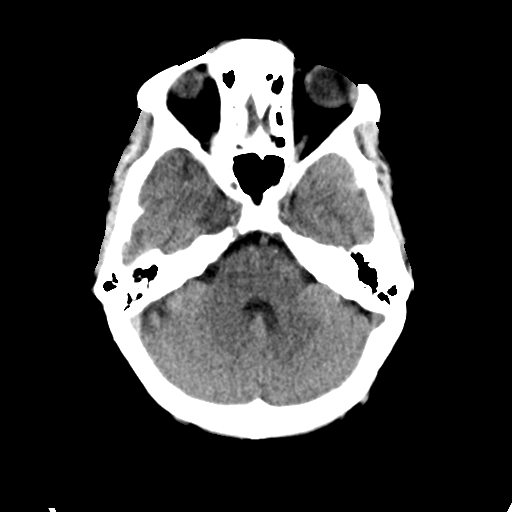
[im 13/33  brain]
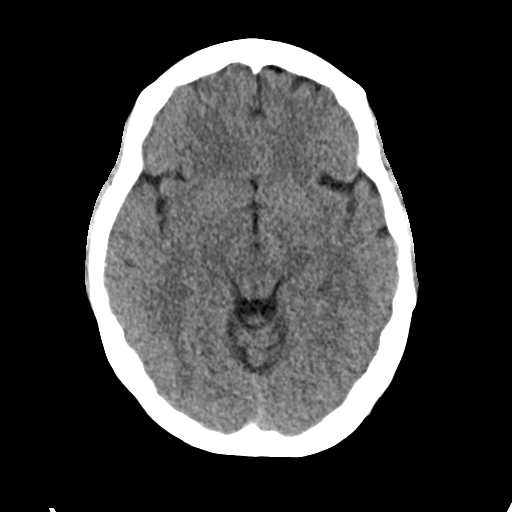
[im 17/33  brain]
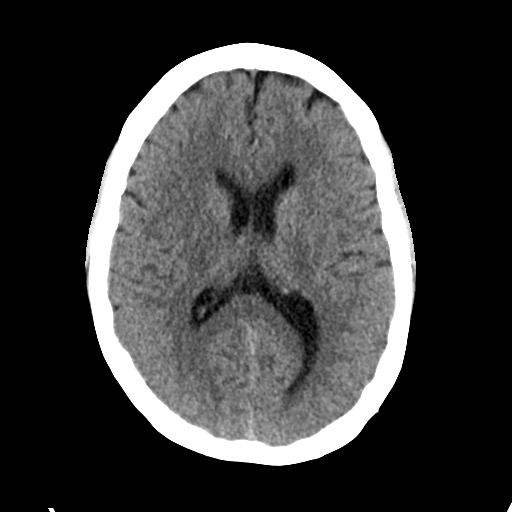
[im 21/33  brain]
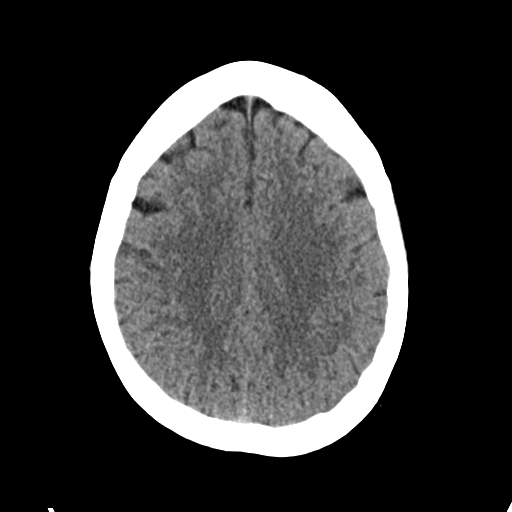
[im 21/33  bone]
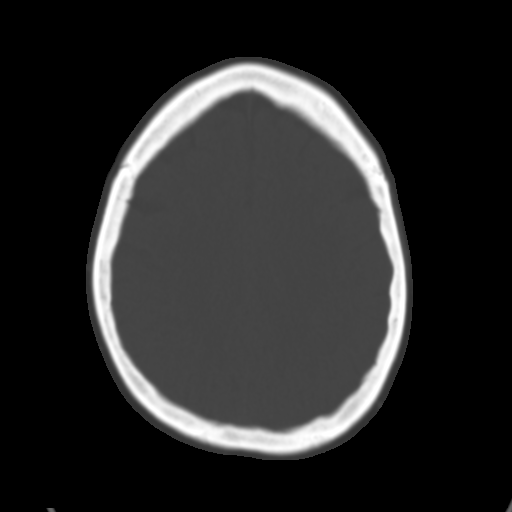
[im 25/33  brain]
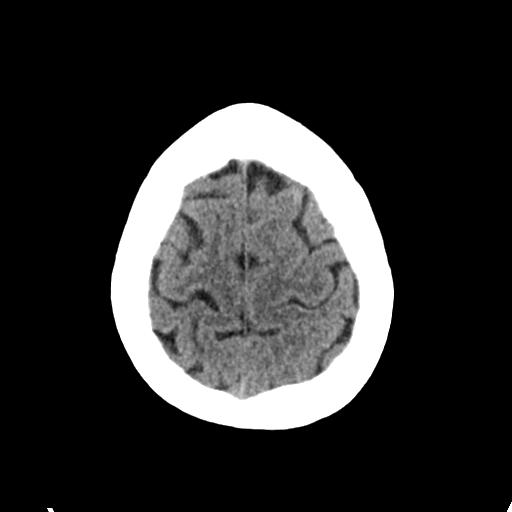
[im 29/33  brain]
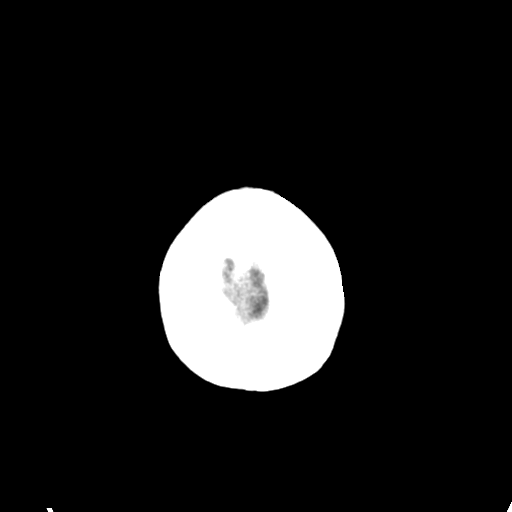

[Series 3: head bone · axial · 0.45mm/px · z∈[-140,-84]mm · 4 of 83 slices shown]
[im 9/83  bone]
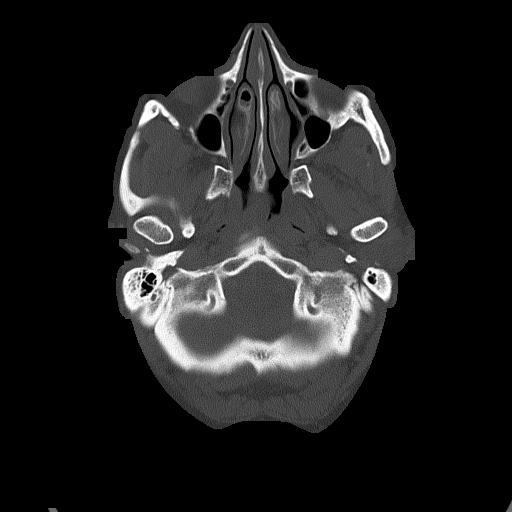
[im 17/83  bone]
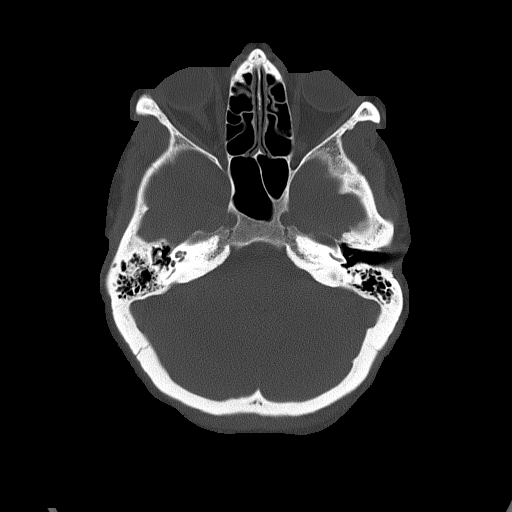
[im 25/83  bone]
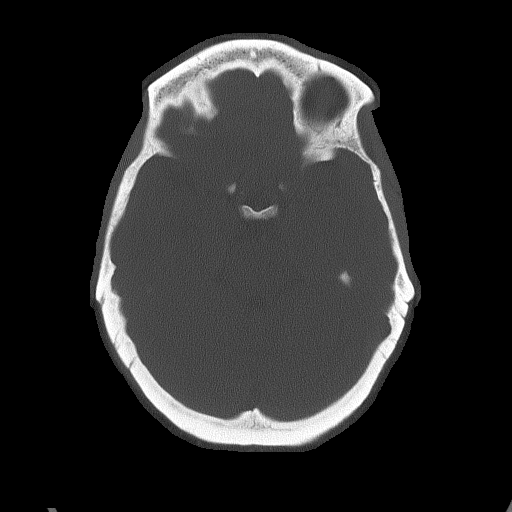
[im 37/83  bone]
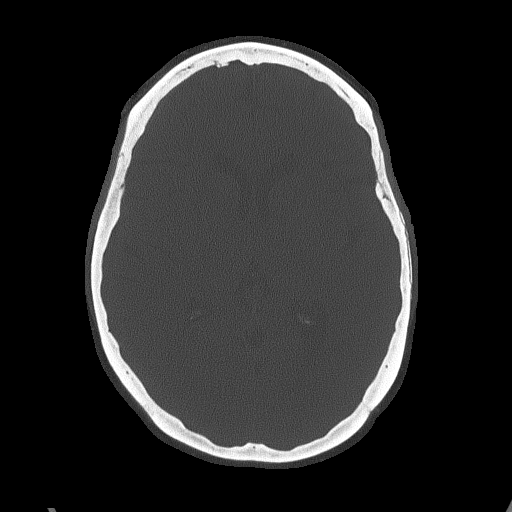

[Series 4: coronal soft tissue · coronal · 0.33mm/px · 3 of 73 slices shown]
[im 25/73  brain]
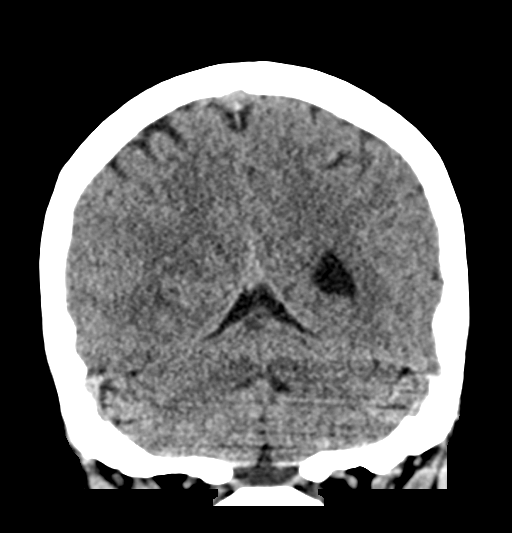
[im 33/73  brain]
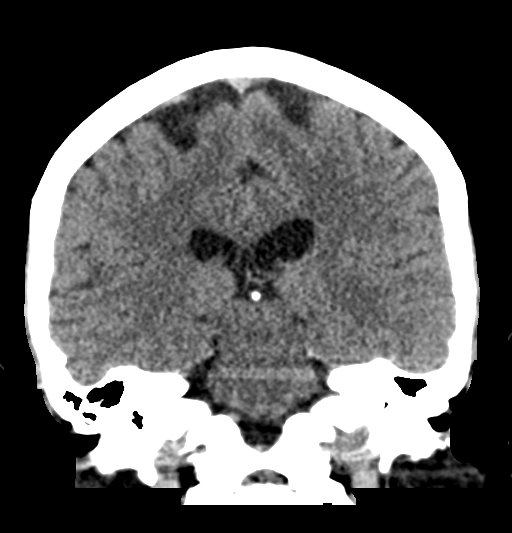
[im 41/73  brain]
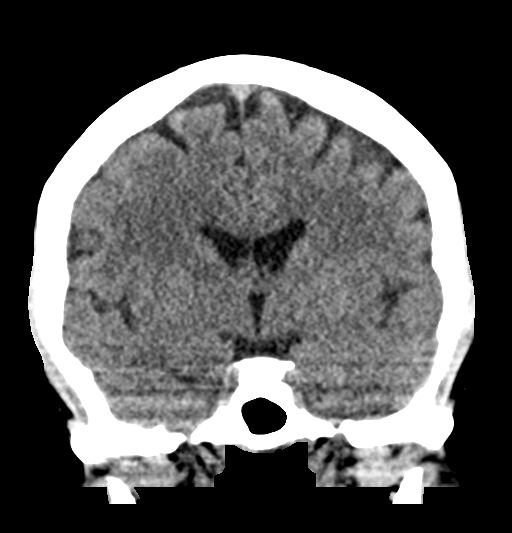

[Series 5: sagittal soft tissue · sagittal · 0.34mm/px · 3 of 57 slices shown]
[im 19/57  brain]
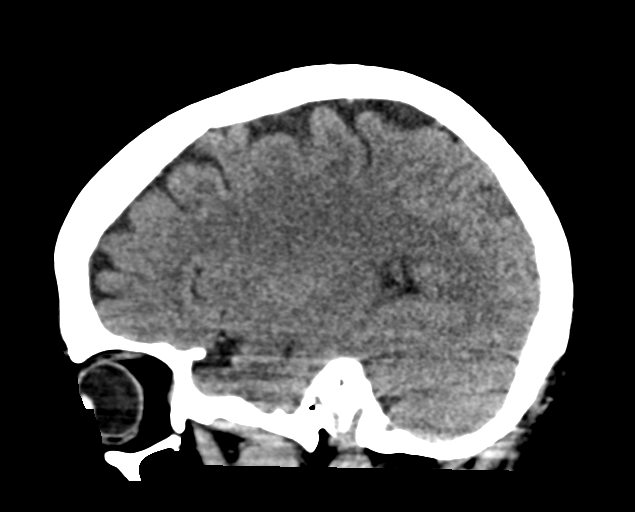
[im 29/57  brain]
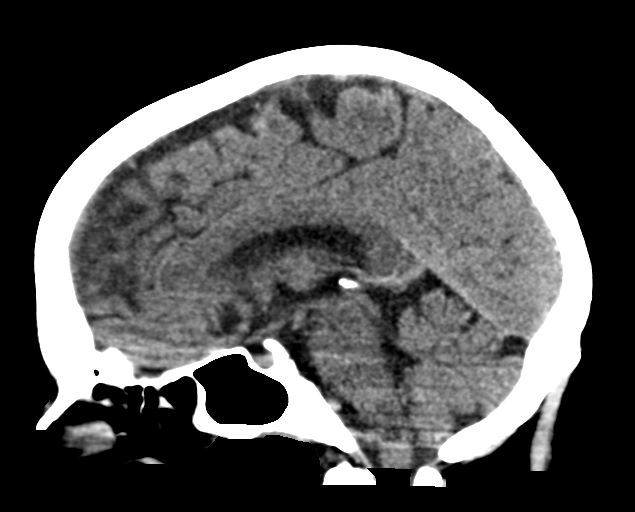
[im 38/57  brain]
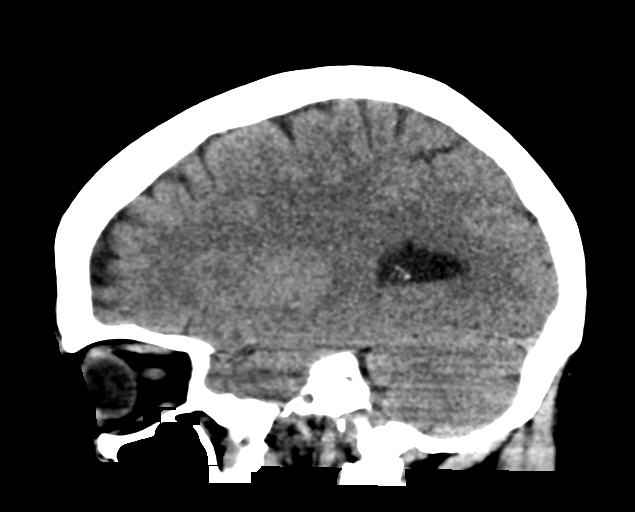

[17 of 47 positions shown; findings below may reference images not displayed]

FINDINGS: Brain: No evidence of acute infarction, hemorrhage, hydrocephalus,
extra-axial collection or mass lesion/mass effect.

Vascular: No hyperdense vessel or unexpected calcification.

Skull: Normal. Negative for fracture or focal lesion.

Sinuses/Orbits: No acute finding.

Other: None.
IMPRESSION: No acute intracranial pathology.

## 2021-09-29 DIAGNOSIS — I493 Ventricular premature depolarization: Secondary | ICD-10-CM | POA: Diagnosis not present

## 2021-09-30 DIAGNOSIS — R0609 Other forms of dyspnea: Secondary | ICD-10-CM | POA: Diagnosis not present

## 2021-09-30 DIAGNOSIS — E785 Hyperlipidemia, unspecified: Secondary | ICD-10-CM | POA: Diagnosis not present

## 2021-09-30 DIAGNOSIS — R0600 Dyspnea, unspecified: Secondary | ICD-10-CM | POA: Diagnosis not present

## 2021-09-30 DIAGNOSIS — E119 Type 2 diabetes mellitus without complications: Secondary | ICD-10-CM | POA: Diagnosis not present

## 2021-10-09 DIAGNOSIS — K029 Dental caries, unspecified: Secondary | ICD-10-CM | POA: Diagnosis not present

## 2021-10-10 ENCOUNTER — Other Ambulatory Visit: Payer: Self-pay | Admitting: Hematology and Oncology

## 2021-10-10 ENCOUNTER — Inpatient Hospital Stay: Payer: BC Managed Care – PPO | Attending: Hematology and Oncology | Admitting: Hematology and Oncology

## 2021-10-10 ENCOUNTER — Other Ambulatory Visit: Payer: Self-pay

## 2021-10-10 ENCOUNTER — Inpatient Hospital Stay: Payer: BC Managed Care – PPO

## 2021-10-10 VITALS — BP 120/76 | HR 65 | Temp 97.1°F | Resp 17 | Wt 209.5 lb

## 2021-10-10 DIAGNOSIS — Z794 Long term (current) use of insulin: Secondary | ICD-10-CM | POA: Diagnosis not present

## 2021-10-10 DIAGNOSIS — E119 Type 2 diabetes mellitus without complications: Secondary | ICD-10-CM | POA: Insufficient documentation

## 2021-10-10 DIAGNOSIS — F17200 Nicotine dependence, unspecified, uncomplicated: Secondary | ICD-10-CM

## 2021-10-10 DIAGNOSIS — G4733 Obstructive sleep apnea (adult) (pediatric): Secondary | ICD-10-CM | POA: Diagnosis not present

## 2021-10-10 DIAGNOSIS — D751 Secondary polycythemia: Secondary | ICD-10-CM

## 2021-10-10 DIAGNOSIS — F17210 Nicotine dependence, cigarettes, uncomplicated: Secondary | ICD-10-CM | POA: Insufficient documentation

## 2021-10-10 DIAGNOSIS — G4730 Sleep apnea, unspecified: Secondary | ICD-10-CM | POA: Insufficient documentation

## 2021-10-10 DIAGNOSIS — D72829 Elevated white blood cell count, unspecified: Secondary | ICD-10-CM | POA: Insufficient documentation

## 2021-10-10 DIAGNOSIS — Z8719 Personal history of other diseases of the digestive system: Secondary | ICD-10-CM | POA: Diagnosis not present

## 2021-10-10 DIAGNOSIS — R112 Nausea with vomiting, unspecified: Secondary | ICD-10-CM | POA: Diagnosis not present

## 2021-10-10 DIAGNOSIS — Z79899 Other long term (current) drug therapy: Secondary | ICD-10-CM | POA: Diagnosis not present

## 2021-10-10 DIAGNOSIS — M069 Rheumatoid arthritis, unspecified: Secondary | ICD-10-CM | POA: Diagnosis not present

## 2021-10-10 DIAGNOSIS — M810 Age-related osteoporosis without current pathological fracture: Secondary | ICD-10-CM | POA: Insufficient documentation

## 2021-10-10 DIAGNOSIS — M459 Ankylosing spondylitis of unspecified sites in spine: Secondary | ICD-10-CM | POA: Insufficient documentation

## 2021-10-10 LAB — CMP (CANCER CENTER ONLY)
ALT: 21 U/L (ref 0–44)
AST: 15 U/L (ref 15–41)
Albumin: 3.5 g/dL (ref 3.5–5.0)
Alkaline Phosphatase: 83 U/L (ref 38–126)
Anion gap: 9 (ref 5–15)
BUN: 7 mg/dL (ref 6–20)
CO2: 23 mmol/L (ref 22–32)
Calcium: 9.5 mg/dL (ref 8.9–10.3)
Chloride: 109 mmol/L (ref 98–111)
Creatinine: 0.72 mg/dL (ref 0.44–1.00)
GFR, Estimated: >60 mL/min (ref >60)
Glucose, Bld: 112 mg/dL — ABNORMAL HIGH (ref 70–99)
Potassium: 4 mmol/L (ref 3.5–5.1)
Sodium: 141 mmol/L (ref 135–145)
Total Bilirubin: 0.3 mg/dL (ref 0.3–1.2)
Total Protein: 6.9 g/dL (ref 6.5–8.1)

## 2021-10-10 LAB — CBC WITH DIFFERENTIAL (CANCER CENTER ONLY)
Abs Immature Granulocytes: 0.05 10*3/uL (ref 0.00–0.07)
Basophils Absolute: 0.1 10*3/uL (ref 0.0–0.1)
Basophils Relative: 0 %
Eosinophils Absolute: 0.2 10*3/uL (ref 0.0–0.5)
Eosinophils Relative: 2 %
HCT: 47 % — ABNORMAL HIGH (ref 36.0–46.0)
Hemoglobin: 16 g/dL — ABNORMAL HIGH (ref 12.0–15.0)
Immature Granulocytes: 0 %
Lymphocytes Relative: 31 %
Lymphs Abs: 3.7 10*3/uL (ref 0.7–4.0)
MCH: 29.9 pg (ref 26.0–34.0)
MCHC: 34 g/dL (ref 30.0–36.0)
MCV: 87.7 fL (ref 80.0–100.0)
Monocytes Absolute: 1 10*3/uL (ref 0.1–1.0)
Monocytes Relative: 8 %
Neutro Abs: 6.9 10*3/uL (ref 1.7–7.7)
Neutrophils Relative %: 59 %
Platelet Count: 257 10*3/uL (ref 150–400)
RBC: 5.36 MIL/uL — ABNORMAL HIGH (ref 3.87–5.11)
RDW: 13 % (ref 11.5–15.5)
WBC Count: 11.8 10*3/uL — ABNORMAL HIGH (ref 4.0–10.5)
nRBC: 0 % (ref 0.0–0.2)

## 2021-10-11 DIAGNOSIS — E1165 Type 2 diabetes mellitus with hyperglycemia: Secondary | ICD-10-CM | POA: Diagnosis not present

## 2021-10-11 DIAGNOSIS — E782 Mixed hyperlipidemia: Secondary | ICD-10-CM | POA: Diagnosis not present

## 2021-10-11 DIAGNOSIS — M459 Ankylosing spondylitis of unspecified sites in spine: Secondary | ICD-10-CM | POA: Diagnosis not present

## 2021-10-11 DIAGNOSIS — R609 Edema, unspecified: Secondary | ICD-10-CM | POA: Diagnosis not present

## 2021-10-13 NOTE — Progress Notes (Signed)
[address] [phone]   [phone]  PROGRESS NOTE  Patient Care Team: Antony Contras, MD as PCP - General (Family Medicine)  Hematological/Oncological History # Secondary Polycythemia # Leukocytosis 1)  08/19/2019: WBC 15.6, Hgb 19.2, Hct 55.7, Plt 355 2) 07/17/2020: WBC 11.4, Hgb 16.4, Hct 48.2, Plt 250 3) 08/15/2020: establish care with Dr. Lorenso Courier  4) 07/16/2021: Patient underwent sleep study, results consistent with obstructive sleep apnea.  Interval History:  Jacqueline Coleman 60 y.o. female with medical history significant for secondary polycythemia who presents for a follow up visit. The patient's last visit was on 08/15/2020. In the interim since the last visit she underwent a sleep study on 07/16/2021 with results consistent with obstructive sleep apnea.  On exam today Mrs. Brother reports she has been well overall in the interim since her last visit.  She continues to have issues with nausea and vomiting with no clear etiology.  She is being evaluated by gastroenterology who noted that she did have some gastritis in the stomach.  She reports that she is doing her best to quit smoking and is down to 5 cigarettes/day.  She otherwise has had no major changes in her health.  She denies any itching, chest pain, shortness of breath, or lower extremity pain.  She otherwise denies any fevers, chills, sweats, constipation or diarrhea.  A full 10 point ROS is listed below.  MEDICAL HISTORY:  Past Medical History:  Diagnosis Date   Ankylosing spondylitis ([place])    Diabetes mellitus without complication ([place])    Osteoporosis    Rheumatoid arthritis ([place])     SURGICAL HISTORY: Past Surgical History:  Procedure Laterality Date   CARPAL TUNNEL RELEASE Right 2003   CESAREAN SECTION     CHOLECYSTECTOMY N/A 03/19/2021   Procedure: LAPAROSCOPIC CHOLECYSTECTOMY;  Surgeon: Ralene Ok, MD;  Location: [facility];  Service: General;  Laterality: N/A;     SOCIAL HISTORY: Social History   Socioeconomic History   Marital status: Married    Spouse name: Not on file   Number of children: Not on file   Years of education: Not on file   Highest education level: Not on file  Occupational History   Occupation: retired  Tobacco Use   Smoking status: Every Day    Packs/day: 0.15    Years: 30.00    Pack years: 4.50    Types: Cigarettes   Smokeless tobacco: Never  Vaping Use   Vaping Use: Never used  Substance and Sexual Activity   Alcohol use: Not Currently   Drug use: Yes    Types: Marijuana   Sexual activity: Never  Other Topics Concern   Not on file  Social History Narrative   Not on file   Social Determinants of Health   Financial Resource Strain: Not on file  Food Insecurity: Not on file  Transportation Needs: Not on file  Physical Activity: Not on file  Stress: Not on file  Social Connections: Not on file  Intimate Partner Violence: Not on file    FAMILY HISTORY: Family History  Problem Relation Age of Onset   Diabetes Mother    Diabetes Father    Breast cancer Neg Hx     ALLERGIES:  is allergic to methotrexate, adhesive [tape], codeine, farxiga [dapagliflozin], hydrocodone, lisinopril, losartan, metformin hcl, and prednisone.  MEDICATIONS:  Current Outpatient Medications  Medication Sig Dispense Refill   acetaminophen (TYLENOL) 500 MG tablet Take 2 tablets (1,000 mg total) by mouth every 6 (six) hours  as needed. 30 tablet 0   Biotin 1000 MCG tablet Take 1,000 mcg by mouth daily.     cetirizine (ZYRTEC) 10 MG tablet Take 10 mg by mouth daily.     Continuous Blood Gluc Sensor (DEXCOM G6 SENSOR) MISC SMARTSIG:1 Each Topical Every 10 Days     Continuous Blood Gluc Transmit (DEXCOM G6 TRANSMITTER) MISC      estradiol (ESTRACE) 2 MG tablet Take 2 mg by mouth every other day.     finasteride (PROSCAR) 5 MG tablet Take 5 mg by mouth every other day.     golimumab (SIMPONI ARIA) 50 MG/4ML SOLN injection Inject  12.5 mg into the vein every 8 (eight) weeks.     ibuprofen (MOTRIN IB) 200 MG tablet Take 3 tablets (600 mg total) by mouth every 8 (eight) hours as needed. (Patient taking differently: Take 600 mg by mouth every 8 (eight) hours as needed for mild pain.) 100 tablet 2   insulin lispro (HUMALOG) 100 UNIT/ML injection Inject 62 Units into the skin daily. Pump - 2 units per hour. Max bolus - 15 units. Daily Max - 62 units     ketoconazole (NIZORAL) 2 % shampoo Apply 1 application topically See admin instructions. Three  times a week     leflunomide (ARAVA) 10 MG tablet Take 20 mg by mouth daily.     minoxidil (LONITEN) 2.5 MG tablet Take 0.625 mg by mouth daily.     ondansetron (ZOFRAN) 4 MG tablet Take 1 tablet (4 mg total) by mouth every 6 (six) hours. 12 tablet 0   pantoprazole (PROTONIX) 40 MG tablet Take 1 tablet (40 mg total) by mouth 2 (two) times daily before a meal. 60 tablet 1   progesterone (PROMETRIUM) 100 MG capsule Take 100 mg by mouth every other day.     rosuvastatin (CRESTOR) 5 MG tablet Take 5 mg by mouth See admin instructions. Takes 1 tablet three times a week     spironolactone (ALDACTONE) 100 MG tablet Take 100 mg by mouth 2 (two) times daily.     No current facility-administered medications for this visit.    REVIEW OF SYSTEMS:   Constitutional: ( - ) fevers, ( - )  chills , ( - ) night sweats Eyes: ( - ) blurriness of vision, ( - ) double vision, ( - ) watery eyes Ears, nose, mouth, throat, and face: ( - ) mucositis, ( - ) sore throat Respiratory: ( - ) cough, ( - ) dyspnea, ( - ) wheezes Cardiovascular: ( - ) palpitation, ( - ) chest discomfort, ( - ) lower extremity swelling Gastrointestinal:  ( - ) nausea, ( - ) heartburn, ( - ) change in bowel habits Skin: ( - ) abnormal skin rashes Lymphatics: ( - ) new lymphadenopathy, ( - ) easy bruising Neurological: ( - ) numbness, ( - ) tingling, ( - ) new weaknesses Behavioral/Psych: ( - ) mood change, ( - ) new changes  All  other systems were reviewed with the patient and are negative.  PHYSICAL EXAMINATION:  Vitals:   10/10/21 0757  BP: 120/76  Pulse: 65  Resp: 17  Temp: (!) 97.1 F (36.2 C)  SpO2: 98%   Filed Weights   10/10/21 0757  Weight: 209 lb 8 oz (95 kg)    GENERAL: well appearing middle aged Caucasian female.  alert, no distress and comfortable SKIN: skin color, texture, turgor are normal, no rashes or significant lesions EYES: conjunctiva are pink and non-injected, sclera clear  LUNGS: clear to auscultation and percussion with normal breathing effort HEART: regular rate & rhythm and no murmurs and no lower extremity edema Musculoskeletal: no cyanosis of digits and no clubbing  PSYCH: alert & oriented x 3, fluent speech NEURO: no focal motor/sensory deficits  LABORATORY DATA:  I have reviewed the data as listed CBC Latest Ref Rng & Units 10/10/2021 04/21/2021 04/20/2021  WBC 4.0 - 10.5 K/uL 11.8(H) 12.2(H) 15.6(H)  Hemoglobin 12.0 - 15.0 g/dL 16.0(H) 15.4(H) 14.4  Hematocrit 36.0 - 46.0 % 47.0(H) 46.4(H) 43.8  Platelets 150 - 400 K/uL 257 222 230    CMP Latest Ref Rng & Units 10/10/2021 04/21/2021 04/20/2021  Glucose 70 - 99 mg/dL 112(H) 128(H) 148(H)  BUN 6 - 20 mg/dL 7 7 10   Creatinine 0.44 - 1.00 mg/dL 0.72 0.61 0.57  Sodium 135 - 145 mmol/L 141 135 138  Potassium 3.5 - 5.1 mmol/L 4.0 4.0 3.5  Chloride 98 - 111 mmol/L 109 106 106  CO2 22 - 32 mmol/L 23 22 23   Calcium 8.9 - 10.3 mg/dL 9.5 8.3(L) 8.9  Total Protein 6.5 - 8.1 g/dL 6.9 - 6.4(L)  Total Bilirubin 0.3 - 1.2 mg/dL 0.3 - 0.4  Alkaline Phos 38 - 126 U/L 83 - 61  AST 15 - 41 U/L 15 - 14(L)  ALT 0 - 44 U/L 21 - 20    RADIOGRAPHIC STUDIES: No results found.   ASSESSMENT & PLAN Jacqueline Coleman 60 y.o. female with medical history significant for secondary polycythemia who presents for a follow up visit.  After review the labs, review the records, discussion with the patient the findings are most consistent with a  secondary polycythemia and leukocytosis secondary to smoking and obstructive sleep apnea.  She has no signs or symptoms of would be concerning for a myeloproliferative neoplasm at this time.   The patient did undergo a sleep study and was found to have obstructive sleep apnea.  It was recommended that we prescribed a CPAP machine, unfortunately is not something that we do in hematology clinic.  Therefore we will work towards getting her connected with the right specialist in order to get the CPAP machine ordered.  #Polycythemia, Likely Secondary # Leukocytosis --findings are most consistent with a secondary polycythemia from smoking and OSA.  --recommend smoking cessation.  -- Sleep study results are most consistent with obstructive sleep apnea.  We will need to work towards finding her a specialist to prescribe and then monitor and titrate her CPAP machine. --JAK2 w/ reflex and BCR/ABL both returned negative --RTC in 6 months to reassess after sleep evaluation.    No orders of the defined types were placed in this encounter.   All questions were answered. The patient knows to call the clinic with any problems, questions or concerns.  A total of more than 30 minutes were spent on this encounter and over half of that time was spent on counseling and coordination of care as outlined above.   Ledell Peoples, MD Department of Hematology/Oncology [place] [facility] Phone: [phone] Pager: [phone] Email: [email or web address]  10/13/2021 1:10 PM

## 2021-10-15 DIAGNOSIS — M0579 Rheumatoid arthritis with rheumatoid factor of multiple sites without organ or systems involvement: Secondary | ICD-10-CM | POA: Diagnosis not present

## 2021-10-17 DIAGNOSIS — M0579 Rheumatoid arthritis with rheumatoid factor of multiple sites without organ or systems involvement: Secondary | ICD-10-CM | POA: Diagnosis not present

## 2021-10-17 DIAGNOSIS — M2550 Pain in unspecified joint: Secondary | ICD-10-CM | POA: Diagnosis not present

## 2021-10-17 DIAGNOSIS — Z79899 Other long term (current) drug therapy: Secondary | ICD-10-CM | POA: Diagnosis not present

## 2021-10-17 DIAGNOSIS — M79643 Pain in unspecified hand: Secondary | ICD-10-CM | POA: Diagnosis not present

## 2021-10-23 DIAGNOSIS — M25531 Pain in right wrist: Secondary | ICD-10-CM | POA: Diagnosis not present

## 2021-10-25 DIAGNOSIS — M47816 Spondylosis without myelopathy or radiculopathy, lumbar region: Secondary | ICD-10-CM | POA: Diagnosis not present

## 2021-10-29 DIAGNOSIS — E1169 Type 2 diabetes mellitus with other specified complication: Secondary | ICD-10-CM | POA: Diagnosis not present

## 2021-10-29 DIAGNOSIS — Z794 Long term (current) use of insulin: Secondary | ICD-10-CM | POA: Diagnosis not present

## 2021-11-04 DIAGNOSIS — M459 Ankylosing spondylitis of unspecified sites in spine: Secondary | ICD-10-CM | POA: Diagnosis not present

## 2021-11-04 DIAGNOSIS — M461 Sacroiliitis, not elsewhere classified: Secondary | ICD-10-CM | POA: Diagnosis not present

## 2021-11-04 DIAGNOSIS — M47816 Spondylosis without myelopathy or radiculopathy, lumbar region: Secondary | ICD-10-CM | POA: Diagnosis not present

## 2021-11-05 DIAGNOSIS — N959 Unspecified menopausal and perimenopausal disorder: Secondary | ICD-10-CM | POA: Diagnosis not present

## 2021-11-05 DIAGNOSIS — Z01419 Encounter for gynecological examination (general) (routine) without abnormal findings: Secondary | ICD-10-CM | POA: Diagnosis not present

## 2021-11-05 DIAGNOSIS — Z760 Encounter for issue of repeat prescription: Secondary | ICD-10-CM | POA: Diagnosis not present

## 2021-11-05 DIAGNOSIS — Z6836 Body mass index (BMI) 36.0-36.9, adult: Secondary | ICD-10-CM | POA: Diagnosis not present

## 2021-11-05 DIAGNOSIS — Z1231 Encounter for screening mammogram for malignant neoplasm of breast: Secondary | ICD-10-CM | POA: Diagnosis not present

## 2021-11-08 DIAGNOSIS — M47816 Spondylosis without myelopathy or radiculopathy, lumbar region: Secondary | ICD-10-CM | POA: Diagnosis not present

## 2021-11-08 DIAGNOSIS — M459 Ankylosing spondylitis of unspecified sites in spine: Secondary | ICD-10-CM | POA: Diagnosis not present

## 2021-11-08 DIAGNOSIS — M461 Sacroiliitis, not elsewhere classified: Secondary | ICD-10-CM | POA: Diagnosis not present

## 2021-11-12 DIAGNOSIS — M5442 Lumbago with sciatica, left side: Secondary | ICD-10-CM | POA: Diagnosis not present

## 2021-11-12 DIAGNOSIS — M25552 Pain in left hip: Secondary | ICD-10-CM | POA: Diagnosis not present

## 2021-11-15 DIAGNOSIS — M0579 Rheumatoid arthritis with rheumatoid factor of multiple sites without organ or systems involvement: Secondary | ICD-10-CM | POA: Diagnosis not present

## 2021-11-20 DIAGNOSIS — M47816 Spondylosis without myelopathy or radiculopathy, lumbar region: Secondary | ICD-10-CM | POA: Diagnosis not present

## 2021-11-20 DIAGNOSIS — M4316 Spondylolisthesis, lumbar region: Secondary | ICD-10-CM | POA: Diagnosis not present

## 2021-11-20 DIAGNOSIS — M48061 Spinal stenosis, lumbar region without neurogenic claudication: Secondary | ICD-10-CM | POA: Diagnosis not present

## 2021-11-26 DIAGNOSIS — M25552 Pain in left hip: Secondary | ICD-10-CM | POA: Diagnosis not present

## 2021-11-26 DIAGNOSIS — K219 Gastro-esophageal reflux disease without esophagitis: Secondary | ICD-10-CM | POA: Diagnosis not present

## 2021-11-26 DIAGNOSIS — K2970 Gastritis, unspecified, without bleeding: Secondary | ICD-10-CM | POA: Diagnosis not present

## 2021-11-29 DIAGNOSIS — M0579 Rheumatoid arthritis with rheumatoid factor of multiple sites without organ or systems involvement: Secondary | ICD-10-CM | POA: Diagnosis not present

## 2021-12-13 DIAGNOSIS — M0579 Rheumatoid arthritis with rheumatoid factor of multiple sites without organ or systems involvement: Secondary | ICD-10-CM | POA: Diagnosis not present

## 2021-12-17 DIAGNOSIS — R0609 Other forms of dyspnea: Secondary | ICD-10-CM | POA: Diagnosis not present

## 2022-01-10 DIAGNOSIS — M0579 Rheumatoid arthritis with rheumatoid factor of multiple sites without organ or systems involvement: Secondary | ICD-10-CM | POA: Diagnosis not present

## 2022-01-15 DIAGNOSIS — M79643 Pain in unspecified hand: Secondary | ICD-10-CM | POA: Diagnosis not present

## 2022-01-15 DIAGNOSIS — M0579 Rheumatoid arthritis with rheumatoid factor of multiple sites without organ or systems involvement: Secondary | ICD-10-CM | POA: Diagnosis not present

## 2022-01-15 DIAGNOSIS — M2550 Pain in unspecified joint: Secondary | ICD-10-CM | POA: Diagnosis not present

## 2022-01-15 DIAGNOSIS — Z79899 Other long term (current) drug therapy: Secondary | ICD-10-CM | POA: Diagnosis not present

## 2022-01-28 DIAGNOSIS — E1169 Type 2 diabetes mellitus with other specified complication: Secondary | ICD-10-CM | POA: Diagnosis not present

## 2022-01-28 DIAGNOSIS — M0579 Rheumatoid arthritis with rheumatoid factor of multiple sites without organ or systems involvement: Secondary | ICD-10-CM | POA: Diagnosis not present

## 2022-01-28 DIAGNOSIS — E785 Hyperlipidemia, unspecified: Secondary | ICD-10-CM | POA: Diagnosis not present

## 2022-01-29 DIAGNOSIS — E1169 Type 2 diabetes mellitus with other specified complication: Secondary | ICD-10-CM | POA: Diagnosis not present

## 2022-01-29 DIAGNOSIS — Z794 Long term (current) use of insulin: Secondary | ICD-10-CM | POA: Diagnosis not present

## 2022-02-04 DIAGNOSIS — E785 Hyperlipidemia, unspecified: Secondary | ICD-10-CM | POA: Diagnosis not present

## 2022-02-04 DIAGNOSIS — Z9641 Presence of insulin pump (external) (internal): Secondary | ICD-10-CM | POA: Diagnosis not present

## 2022-02-04 DIAGNOSIS — E1169 Type 2 diabetes mellitus with other specified complication: Secondary | ICD-10-CM | POA: Diagnosis not present

## 2022-02-04 DIAGNOSIS — K3184 Gastroparesis: Secondary | ICD-10-CM | POA: Diagnosis not present

## 2022-02-06 DIAGNOSIS — M459 Ankylosing spondylitis of unspecified sites in spine: Secondary | ICD-10-CM | POA: Diagnosis not present

## 2022-02-06 DIAGNOSIS — G4733 Obstructive sleep apnea (adult) (pediatric): Secondary | ICD-10-CM | POA: Diagnosis not present

## 2022-02-06 DIAGNOSIS — R609 Edema, unspecified: Secondary | ICD-10-CM | POA: Diagnosis not present

## 2022-02-07 DIAGNOSIS — M0579 Rheumatoid arthritis with rheumatoid factor of multiple sites without organ or systems involvement: Secondary | ICD-10-CM | POA: Diagnosis not present

## 2022-02-11 DIAGNOSIS — G4733 Obstructive sleep apnea (adult) (pediatric): Secondary | ICD-10-CM | POA: Diagnosis not present

## 2022-02-11 DIAGNOSIS — E119 Type 2 diabetes mellitus without complications: Secondary | ICD-10-CM | POA: Diagnosis not present

## 2022-03-18 DIAGNOSIS — M0579 Rheumatoid arthritis with rheumatoid factor of multiple sites without organ or systems involvement: Secondary | ICD-10-CM | POA: Diagnosis not present

## 2022-04-04 DIAGNOSIS — G4733 Obstructive sleep apnea (adult) (pediatric): Secondary | ICD-10-CM | POA: Diagnosis not present

## 2022-04-09 ENCOUNTER — Inpatient Hospital Stay: Payer: BC Managed Care – PPO

## 2022-04-09 ENCOUNTER — Inpatient Hospital Stay: Payer: BC Managed Care – PPO | Admitting: Hematology and Oncology

## 2022-04-14 DIAGNOSIS — Z79899 Other long term (current) drug therapy: Secondary | ICD-10-CM | POA: Diagnosis not present

## 2022-04-14 DIAGNOSIS — M0579 Rheumatoid arthritis with rheumatoid factor of multiple sites without organ or systems involvement: Secondary | ICD-10-CM | POA: Diagnosis not present

## 2022-04-14 DIAGNOSIS — M79643 Pain in unspecified hand: Secondary | ICD-10-CM | POA: Diagnosis not present

## 2022-04-14 DIAGNOSIS — M2550 Pain in unspecified joint: Secondary | ICD-10-CM | POA: Diagnosis not present

## 2022-04-15 DIAGNOSIS — E785 Hyperlipidemia, unspecified: Secondary | ICD-10-CM | POA: Diagnosis not present

## 2022-04-15 DIAGNOSIS — M0579 Rheumatoid arthritis with rheumatoid factor of multiple sites without organ or systems involvement: Secondary | ICD-10-CM | POA: Diagnosis not present

## 2022-04-17 DIAGNOSIS — E1169 Type 2 diabetes mellitus with other specified complication: Secondary | ICD-10-CM | POA: Diagnosis not present

## 2022-04-17 DIAGNOSIS — E782 Mixed hyperlipidemia: Secondary | ICD-10-CM | POA: Diagnosis not present

## 2022-04-17 DIAGNOSIS — R609 Edema, unspecified: Secondary | ICD-10-CM | POA: Diagnosis not present

## 2022-04-18 DIAGNOSIS — E119 Type 2 diabetes mellitus without complications: Secondary | ICD-10-CM | POA: Diagnosis not present

## 2022-04-18 DIAGNOSIS — Z794 Long term (current) use of insulin: Secondary | ICD-10-CM | POA: Diagnosis not present

## 2022-04-18 DIAGNOSIS — H2513 Age-related nuclear cataract, bilateral: Secondary | ICD-10-CM | POA: Diagnosis not present

## 2022-04-18 DIAGNOSIS — H0288A Meibomian gland dysfunction right eye, upper and lower eyelids: Secondary | ICD-10-CM | POA: Diagnosis not present

## 2022-04-22 DIAGNOSIS — E1169 Type 2 diabetes mellitus with other specified complication: Secondary | ICD-10-CM | POA: Diagnosis not present

## 2022-04-22 DIAGNOSIS — Z794 Long term (current) use of insulin: Secondary | ICD-10-CM | POA: Diagnosis not present

## 2022-05-04 DIAGNOSIS — G4733 Obstructive sleep apnea (adult) (pediatric): Secondary | ICD-10-CM | POA: Diagnosis not present

## 2022-05-06 DIAGNOSIS — Z23 Encounter for immunization: Secondary | ICD-10-CM | POA: Diagnosis not present

## 2022-05-06 DIAGNOSIS — E785 Hyperlipidemia, unspecified: Secondary | ICD-10-CM | POA: Diagnosis not present

## 2022-05-06 DIAGNOSIS — Z9641 Presence of insulin pump (external) (internal): Secondary | ICD-10-CM | POA: Diagnosis not present

## 2022-05-06 DIAGNOSIS — E1169 Type 2 diabetes mellitus with other specified complication: Secondary | ICD-10-CM | POA: Diagnosis not present

## 2022-05-13 DIAGNOSIS — M0579 Rheumatoid arthritis with rheumatoid factor of multiple sites without organ or systems involvement: Secondary | ICD-10-CM | POA: Diagnosis not present

## 2022-05-20 DIAGNOSIS — R609 Edema, unspecified: Secondary | ICD-10-CM | POA: Diagnosis not present

## 2022-06-04 DIAGNOSIS — G4733 Obstructive sleep apnea (adult) (pediatric): Secondary | ICD-10-CM | POA: Diagnosis not present

## 2022-06-11 DIAGNOSIS — M0579 Rheumatoid arthritis with rheumatoid factor of multiple sites without organ or systems involvement: Secondary | ICD-10-CM | POA: Diagnosis not present

## 2022-07-01 DIAGNOSIS — G4733 Obstructive sleep apnea (adult) (pediatric): Secondary | ICD-10-CM | POA: Diagnosis not present

## 2022-07-01 DIAGNOSIS — E119 Type 2 diabetes mellitus without complications: Secondary | ICD-10-CM | POA: Diagnosis not present

## 2022-07-04 DIAGNOSIS — G4733 Obstructive sleep apnea (adult) (pediatric): Secondary | ICD-10-CM | POA: Diagnosis not present

## 2022-07-07 DIAGNOSIS — U071 COVID-19: Secondary | ICD-10-CM | POA: Diagnosis not present

## 2022-07-09 DIAGNOSIS — G4733 Obstructive sleep apnea (adult) (pediatric): Secondary | ICD-10-CM | POA: Diagnosis not present

## 2022-07-11 DIAGNOSIS — E1169 Type 2 diabetes mellitus with other specified complication: Secondary | ICD-10-CM | POA: Diagnosis not present

## 2022-07-11 DIAGNOSIS — Z794 Long term (current) use of insulin: Secondary | ICD-10-CM | POA: Diagnosis not present

## 2022-07-15 DIAGNOSIS — M0579 Rheumatoid arthritis with rheumatoid factor of multiple sites without organ or systems involvement: Secondary | ICD-10-CM | POA: Diagnosis not present

## 2022-07-29 DIAGNOSIS — M79643 Pain in unspecified hand: Secondary | ICD-10-CM | POA: Diagnosis not present

## 2022-07-29 DIAGNOSIS — M2550 Pain in unspecified joint: Secondary | ICD-10-CM | POA: Diagnosis not present

## 2022-07-29 DIAGNOSIS — M0579 Rheumatoid arthritis with rheumatoid factor of multiple sites without organ or systems involvement: Secondary | ICD-10-CM | POA: Diagnosis not present

## 2022-07-29 DIAGNOSIS — Z79899 Other long term (current) drug therapy: Secondary | ICD-10-CM | POA: Diagnosis not present

## 2022-07-29 DIAGNOSIS — M25531 Pain in right wrist: Secondary | ICD-10-CM | POA: Diagnosis not present

## 2022-08-12 DIAGNOSIS — E669 Obesity, unspecified: Secondary | ICD-10-CM | POA: Diagnosis not present

## 2022-08-12 DIAGNOSIS — E119 Type 2 diabetes mellitus without complications: Secondary | ICD-10-CM | POA: Diagnosis not present

## 2022-08-12 DIAGNOSIS — M0579 Rheumatoid arthritis with rheumatoid factor of multiple sites without organ or systems involvement: Secondary | ICD-10-CM | POA: Diagnosis not present

## 2022-08-12 DIAGNOSIS — G4733 Obstructive sleep apnea (adult) (pediatric): Secondary | ICD-10-CM | POA: Diagnosis not present

## 2022-09-09 DIAGNOSIS — M0579 Rheumatoid arthritis with rheumatoid factor of multiple sites without organ or systems involvement: Secondary | ICD-10-CM | POA: Diagnosis not present

## 2022-09-10 DIAGNOSIS — Z79899 Other long term (current) drug therapy: Secondary | ICD-10-CM | POA: Diagnosis not present

## 2022-09-10 DIAGNOSIS — M79643 Pain in unspecified hand: Secondary | ICD-10-CM | POA: Diagnosis not present

## 2022-09-10 DIAGNOSIS — M0579 Rheumatoid arthritis with rheumatoid factor of multiple sites without organ or systems involvement: Secondary | ICD-10-CM | POA: Diagnosis not present

## 2022-09-10 DIAGNOSIS — M2550 Pain in unspecified joint: Secondary | ICD-10-CM | POA: Diagnosis not present

## 2022-09-17 DIAGNOSIS — M1712 Unilateral primary osteoarthritis, left knee: Secondary | ICD-10-CM | POA: Diagnosis not present

## 2022-09-17 DIAGNOSIS — M1711 Unilateral primary osteoarthritis, right knee: Secondary | ICD-10-CM | POA: Diagnosis not present

## 2022-09-17 DIAGNOSIS — M170 Bilateral primary osteoarthritis of knee: Secondary | ICD-10-CM | POA: Diagnosis not present

## 2022-09-22 DIAGNOSIS — M05762 Rheumatoid arthritis with rheumatoid factor of left knee without organ or systems involvement: Secondary | ICD-10-CM | POA: Diagnosis not present

## 2022-09-22 DIAGNOSIS — M25562 Pain in left knee: Secondary | ICD-10-CM | POA: Diagnosis not present

## 2022-09-22 DIAGNOSIS — M25561 Pain in right knee: Secondary | ICD-10-CM | POA: Diagnosis not present

## 2022-09-22 DIAGNOSIS — M05761 Rheumatoid arthritis with rheumatoid factor of right knee without organ or systems involvement: Secondary | ICD-10-CM | POA: Diagnosis not present

## 2022-09-25 DIAGNOSIS — M25562 Pain in left knee: Secondary | ICD-10-CM | POA: Diagnosis not present

## 2022-09-25 DIAGNOSIS — M05761 Rheumatoid arthritis with rheumatoid factor of right knee without organ or systems involvement: Secondary | ICD-10-CM | POA: Diagnosis not present

## 2022-09-25 DIAGNOSIS — M25561 Pain in right knee: Secondary | ICD-10-CM | POA: Diagnosis not present

## 2022-09-25 DIAGNOSIS — M05762 Rheumatoid arthritis with rheumatoid factor of left knee without organ or systems involvement: Secondary | ICD-10-CM | POA: Diagnosis not present

## 2022-09-29 DIAGNOSIS — M21622 Bunionette of left foot: Secondary | ICD-10-CM | POA: Diagnosis not present

## 2022-09-29 DIAGNOSIS — M21621 Bunionette of right foot: Secondary | ICD-10-CM | POA: Diagnosis not present

## 2022-09-29 DIAGNOSIS — B351 Tinea unguium: Secondary | ICD-10-CM | POA: Diagnosis not present

## 2022-09-30 DIAGNOSIS — M05761 Rheumatoid arthritis with rheumatoid factor of right knee without organ or systems involvement: Secondary | ICD-10-CM | POA: Diagnosis not present

## 2022-09-30 DIAGNOSIS — E1169 Type 2 diabetes mellitus with other specified complication: Secondary | ICD-10-CM | POA: Diagnosis not present

## 2022-09-30 DIAGNOSIS — M25561 Pain in right knee: Secondary | ICD-10-CM | POA: Diagnosis not present

## 2022-09-30 DIAGNOSIS — M05762 Rheumatoid arthritis with rheumatoid factor of left knee without organ or systems involvement: Secondary | ICD-10-CM | POA: Diagnosis not present

## 2022-09-30 DIAGNOSIS — Z794 Long term (current) use of insulin: Secondary | ICD-10-CM | POA: Diagnosis not present

## 2022-09-30 DIAGNOSIS — M25562 Pain in left knee: Secondary | ICD-10-CM | POA: Diagnosis not present

## 2022-10-01 DIAGNOSIS — M170 Bilateral primary osteoarthritis of knee: Secondary | ICD-10-CM | POA: Diagnosis not present

## 2022-10-01 DIAGNOSIS — M1712 Unilateral primary osteoarthritis, left knee: Secondary | ICD-10-CM | POA: Diagnosis not present

## 2022-10-01 DIAGNOSIS — M1711 Unilateral primary osteoarthritis, right knee: Secondary | ICD-10-CM | POA: Diagnosis not present

## 2022-10-06 DIAGNOSIS — M21622 Bunionette of left foot: Secondary | ICD-10-CM | POA: Diagnosis not present

## 2022-10-06 DIAGNOSIS — M21621 Bunionette of right foot: Secondary | ICD-10-CM | POA: Diagnosis not present

## 2022-10-06 DIAGNOSIS — B351 Tinea unguium: Secondary | ICD-10-CM | POA: Diagnosis not present

## 2022-10-07 DIAGNOSIS — M0579 Rheumatoid arthritis with rheumatoid factor of multiple sites without organ or systems involvement: Secondary | ICD-10-CM | POA: Diagnosis not present

## 2022-10-08 DIAGNOSIS — M1711 Unilateral primary osteoarthritis, right knee: Secondary | ICD-10-CM | POA: Diagnosis not present

## 2022-10-08 DIAGNOSIS — M1712 Unilateral primary osteoarthritis, left knee: Secondary | ICD-10-CM | POA: Diagnosis not present

## 2022-10-08 DIAGNOSIS — M170 Bilateral primary osteoarthritis of knee: Secondary | ICD-10-CM | POA: Diagnosis not present

## 2022-10-14 DIAGNOSIS — G4733 Obstructive sleep apnea (adult) (pediatric): Secondary | ICD-10-CM | POA: Diagnosis not present

## 2022-10-15 DIAGNOSIS — M1711 Unilateral primary osteoarthritis, right knee: Secondary | ICD-10-CM | POA: Diagnosis not present

## 2022-10-15 DIAGNOSIS — M1712 Unilateral primary osteoarthritis, left knee: Secondary | ICD-10-CM | POA: Diagnosis not present

## 2022-10-15 DIAGNOSIS — M170 Bilateral primary osteoarthritis of knee: Secondary | ICD-10-CM | POA: Diagnosis not present

## 2022-11-04 DIAGNOSIS — M0579 Rheumatoid arthritis with rheumatoid factor of multiple sites without organ or systems involvement: Secondary | ICD-10-CM | POA: Diagnosis not present

## 2022-11-06 DIAGNOSIS — Z23 Encounter for immunization: Secondary | ICD-10-CM | POA: Diagnosis not present

## 2022-11-06 DIAGNOSIS — E1169 Type 2 diabetes mellitus with other specified complication: Secondary | ICD-10-CM | POA: Diagnosis not present

## 2022-11-06 DIAGNOSIS — E785 Hyperlipidemia, unspecified: Secondary | ICD-10-CM | POA: Diagnosis not present

## 2022-11-06 DIAGNOSIS — Z9641 Presence of insulin pump (external) (internal): Secondary | ICD-10-CM | POA: Diagnosis not present

## 2022-11-06 DIAGNOSIS — E538 Deficiency of other specified B group vitamins: Secondary | ICD-10-CM | POA: Diagnosis not present

## 2022-12-02 DIAGNOSIS — M0579 Rheumatoid arthritis with rheumatoid factor of multiple sites without organ or systems involvement: Secondary | ICD-10-CM | POA: Diagnosis not present

## 2022-12-03 DIAGNOSIS — M79643 Pain in unspecified hand: Secondary | ICD-10-CM | POA: Diagnosis not present

## 2022-12-03 DIAGNOSIS — M2550 Pain in unspecified joint: Secondary | ICD-10-CM | POA: Diagnosis not present

## 2022-12-03 DIAGNOSIS — Z79899 Other long term (current) drug therapy: Secondary | ICD-10-CM | POA: Diagnosis not present

## 2022-12-03 DIAGNOSIS — M0579 Rheumatoid arthritis with rheumatoid factor of multiple sites without organ or systems involvement: Secondary | ICD-10-CM | POA: Diagnosis not present

## 2022-12-03 DIAGNOSIS — M79644 Pain in right finger(s): Secondary | ICD-10-CM | POA: Diagnosis not present

## 2022-12-24 DIAGNOSIS — Z86010 Personal history of colonic polyps: Secondary | ICD-10-CM | POA: Diagnosis not present

## 2022-12-24 DIAGNOSIS — K2970 Gastritis, unspecified, without bleeding: Secondary | ICD-10-CM | POA: Diagnosis not present

## 2022-12-24 DIAGNOSIS — K219 Gastro-esophageal reflux disease without esophagitis: Secondary | ICD-10-CM | POA: Diagnosis not present

## 2022-12-30 DIAGNOSIS — M0579 Rheumatoid arthritis with rheumatoid factor of multiple sites without organ or systems involvement: Secondary | ICD-10-CM | POA: Diagnosis not present

## 2022-12-31 DIAGNOSIS — L400 Psoriasis vulgaris: Secondary | ICD-10-CM | POA: Diagnosis not present

## 2023-01-16 DIAGNOSIS — G4733 Obstructive sleep apnea (adult) (pediatric): Secondary | ICD-10-CM | POA: Diagnosis not present

## 2023-01-26 DIAGNOSIS — M8430XA Stress fracture, unspecified site, initial encounter for fracture: Secondary | ICD-10-CM | POA: Diagnosis not present

## 2023-01-26 DIAGNOSIS — M79672 Pain in left foot: Secondary | ICD-10-CM | POA: Diagnosis not present

## 2023-01-26 DIAGNOSIS — B351 Tinea unguium: Secondary | ICD-10-CM | POA: Diagnosis not present

## 2023-01-27 DIAGNOSIS — M0579 Rheumatoid arthritis with rheumatoid factor of multiple sites without organ or systems involvement: Secondary | ICD-10-CM | POA: Diagnosis not present

## 2023-02-11 DIAGNOSIS — Z6834 Body mass index (BMI) 34.0-34.9, adult: Secondary | ICD-10-CM | POA: Diagnosis not present

## 2023-02-11 DIAGNOSIS — Z1231 Encounter for screening mammogram for malignant neoplasm of breast: Secondary | ICD-10-CM | POA: Diagnosis not present

## 2023-02-11 DIAGNOSIS — Z01419 Encounter for gynecological examination (general) (routine) without abnormal findings: Secondary | ICD-10-CM | POA: Diagnosis not present

## 2023-02-24 DIAGNOSIS — M0579 Rheumatoid arthritis with rheumatoid factor of multiple sites without organ or systems involvement: Secondary | ICD-10-CM | POA: Diagnosis not present

## 2023-03-12 DIAGNOSIS — H00025 Hordeolum internum left lower eyelid: Secondary | ICD-10-CM | POA: Diagnosis not present

## 2023-03-26 DIAGNOSIS — H00025 Hordeolum internum left lower eyelid: Secondary | ICD-10-CM | POA: Diagnosis not present

## 2023-04-01 DIAGNOSIS — E1169 Type 2 diabetes mellitus with other specified complication: Secondary | ICD-10-CM | POA: Diagnosis not present

## 2023-04-01 DIAGNOSIS — Z794 Long term (current) use of insulin: Secondary | ICD-10-CM | POA: Diagnosis not present

## 2023-04-06 DIAGNOSIS — M0579 Rheumatoid arthritis with rheumatoid factor of multiple sites without organ or systems involvement: Secondary | ICD-10-CM | POA: Diagnosis not present

## 2023-04-06 DIAGNOSIS — Z79899 Other long term (current) drug therapy: Secondary | ICD-10-CM | POA: Diagnosis not present

## 2023-04-06 DIAGNOSIS — M2550 Pain in unspecified joint: Secondary | ICD-10-CM | POA: Diagnosis not present

## 2023-04-06 DIAGNOSIS — M549 Dorsalgia, unspecified: Secondary | ICD-10-CM | POA: Diagnosis not present

## 2023-04-22 DIAGNOSIS — Z794 Long term (current) use of insulin: Secondary | ICD-10-CM | POA: Diagnosis not present

## 2023-04-22 DIAGNOSIS — E119 Type 2 diabetes mellitus without complications: Secondary | ICD-10-CM | POA: Diagnosis not present

## 2023-04-22 DIAGNOSIS — H2513 Age-related nuclear cataract, bilateral: Secondary | ICD-10-CM | POA: Diagnosis not present

## 2023-04-22 DIAGNOSIS — M069 Rheumatoid arthritis, unspecified: Secondary | ICD-10-CM | POA: Diagnosis not present

## 2023-05-01 DIAGNOSIS — G4733 Obstructive sleep apnea (adult) (pediatric): Secondary | ICD-10-CM | POA: Diagnosis not present

## 2023-05-07 DIAGNOSIS — E1169 Type 2 diabetes mellitus with other specified complication: Secondary | ICD-10-CM | POA: Diagnosis not present

## 2023-05-07 DIAGNOSIS — E785 Hyperlipidemia, unspecified: Secondary | ICD-10-CM | POA: Diagnosis not present

## 2023-05-07 DIAGNOSIS — E538 Deficiency of other specified B group vitamins: Secondary | ICD-10-CM | POA: Diagnosis not present

## 2023-05-12 DIAGNOSIS — M0579 Rheumatoid arthritis with rheumatoid factor of multiple sites without organ or systems involvement: Secondary | ICD-10-CM | POA: Diagnosis not present

## 2023-05-14 DIAGNOSIS — Z9641 Presence of insulin pump (external) (internal): Secondary | ICD-10-CM | POA: Diagnosis not present

## 2023-05-14 DIAGNOSIS — E538 Deficiency of other specified B group vitamins: Secondary | ICD-10-CM | POA: Diagnosis not present

## 2023-05-14 DIAGNOSIS — E785 Hyperlipidemia, unspecified: Secondary | ICD-10-CM | POA: Diagnosis not present

## 2023-05-14 DIAGNOSIS — E1169 Type 2 diabetes mellitus with other specified complication: Secondary | ICD-10-CM | POA: Diagnosis not present

## 2023-06-09 DIAGNOSIS — M0579 Rheumatoid arthritis with rheumatoid factor of multiple sites without organ or systems involvement: Secondary | ICD-10-CM | POA: Diagnosis not present

## 2023-06-30 DIAGNOSIS — M1711 Unilateral primary osteoarthritis, right knee: Secondary | ICD-10-CM | POA: Diagnosis not present

## 2023-06-30 DIAGNOSIS — M170 Bilateral primary osteoarthritis of knee: Secondary | ICD-10-CM | POA: Diagnosis not present

## 2023-06-30 DIAGNOSIS — M1712 Unilateral primary osteoarthritis, left knee: Secondary | ICD-10-CM | POA: Diagnosis not present

## 2023-07-07 DIAGNOSIS — M0579 Rheumatoid arthritis with rheumatoid factor of multiple sites without organ or systems involvement: Secondary | ICD-10-CM | POA: Diagnosis not present

## 2023-07-21 DIAGNOSIS — L03211 Cellulitis of face: Secondary | ICD-10-CM | POA: Diagnosis not present

## 2023-07-28 ENCOUNTER — Other Ambulatory Visit: Payer: Self-pay

## 2023-07-28 ENCOUNTER — Emergency Department (HOSPITAL_BASED_OUTPATIENT_CLINIC_OR_DEPARTMENT_OTHER)
Admission: EM | Admit: 2023-07-28 | Discharge: 2023-07-28 | Disposition: A | Discharge status: Home / Self Care | Payer: BC Managed Care – PPO | Attending: Emergency Medicine | Admitting: Emergency Medicine

## 2023-07-28 ENCOUNTER — Encounter (HOSPITAL_BASED_OUTPATIENT_CLINIC_OR_DEPARTMENT_OTHER): Payer: Self-pay

## 2023-07-28 DIAGNOSIS — R112 Nausea with vomiting, unspecified: Secondary | ICD-10-CM | POA: Diagnosis not present

## 2023-07-28 DIAGNOSIS — R109 Unspecified abdominal pain: Secondary | ICD-10-CM | POA: Insufficient documentation

## 2023-07-28 DIAGNOSIS — E119 Type 2 diabetes mellitus without complications: Secondary | ICD-10-CM | POA: Diagnosis not present

## 2023-07-28 DIAGNOSIS — Z79899 Other long term (current) drug therapy: Secondary | ICD-10-CM | POA: Insufficient documentation

## 2023-07-28 DIAGNOSIS — Z794 Long term (current) use of insulin: Secondary | ICD-10-CM | POA: Diagnosis not present

## 2023-07-28 LAB — URINALYSIS, ROUTINE W REFLEX MICROSCOPIC
Bacteria, UA: NONE SEEN
Bilirubin Urine: NEGATIVE
Glucose, UA: NEGATIVE mg/dL
Ketones, ur: >80 mg/dL — AB
Leukocytes,Ua: NEGATIVE
Nitrite: NEGATIVE
Protein, ur: 30 mg/dL — AB
Specific Gravity, Urine: 1.036 — ABNORMAL HIGH (ref 1.005–1.030)
pH: 5.5 (ref 5.0–8.0)

## 2023-07-28 LAB — COMPREHENSIVE METABOLIC PANEL
ALT: 21 U/L (ref 0–44)
AST: 18 U/L (ref 15–41)
Albumin: 4.4 g/dL (ref 3.5–5.0)
Alkaline Phosphatase: 67 U/L (ref 38–126)
Anion gap: 16 — ABNORMAL HIGH (ref 5–15)
BUN: 15 mg/dL (ref 6–20)
CO2: 23 mmol/L (ref 22–32)
Calcium: 9.8 mg/dL (ref 8.9–10.3)
Chloride: 95 mmol/L — ABNORMAL LOW (ref 98–111)
Creatinine, Ser: 0.59 mg/dL (ref 0.44–1.00)
GFR, Estimated: >60 mL/min (ref >60)
Glucose, Bld: 148 mg/dL — ABNORMAL HIGH (ref 70–99)
Potassium: 3.5 mmol/L (ref 3.5–5.1)
Sodium: 134 mmol/L — ABNORMAL LOW (ref 135–145)
Total Bilirubin: 0.7 mg/dL (ref 0.3–1.2)
Total Protein: 7.6 g/dL (ref 6.5–8.1)

## 2023-07-28 LAB — CBC
HCT: 50.7 % — ABNORMAL HIGH (ref 36.0–46.0)
Hemoglobin: 17.8 g/dL — ABNORMAL HIGH (ref 12.0–15.0)
MCH: 30.2 pg (ref 26.0–34.0)
MCHC: 35.1 g/dL (ref 30.0–36.0)
MCV: 86.1 fL (ref 80.0–100.0)
Platelets: 315 10*3/uL (ref 150–400)
RBC: 5.89 MIL/uL — ABNORMAL HIGH (ref 3.87–5.11)
RDW: 12.3 % (ref 11.5–15.5)
WBC: 15.9 10*3/uL — ABNORMAL HIGH (ref 4.0–10.5)
nRBC: 0 % (ref 0.0–0.2)

## 2023-07-28 LAB — LIPASE, BLOOD: Lipase: 13 U/L (ref 11–51)

## 2023-07-28 MED ORDER — ONDANSETRON HCL 4 MG/2ML IJ SOLN
4.0000 mg | Freq: Once | INTRAMUSCULAR | Status: AC
  Administered 2023-07-28: 4 mg via INTRAVENOUS
  Filled 2023-07-28: qty 2

## 2023-07-28 MED ORDER — ONDANSETRON HCL 4 MG/2ML IJ SOLN
4.0000 mg | Freq: Once | INTRAMUSCULAR | Status: AC | PRN
  Administered 2023-07-28: 4 mg via INTRAVENOUS
  Filled 2023-07-28: qty 2

## 2023-07-28 MED ORDER — LACTATED RINGERS IV BOLUS
1000.0000 mL | Freq: Once | INTRAVENOUS | Status: AC
  Administered 2023-07-28: 1000 mL via INTRAVENOUS

## 2023-07-28 MED ORDER — ONDANSETRON 4 MG PO TBDP: 4.0000 mg | ORAL_TABLET | Freq: Once | ORAL | Status: DC | PRN

## 2023-07-28 MED ORDER — FENTANYL CITRATE PF 50 MCG/ML IJ SOSY
50.0000 ug | PREFILLED_SYRINGE | Freq: Once | INTRAMUSCULAR | Status: AC
  Administered 2023-07-28: 50 ug via INTRAVENOUS
  Filled 2023-07-28: qty 1

## 2023-07-28 NOTE — ED Triage Notes (Signed)
Pt c/o abdominal pain & vomiting since Sunday night.

## 2023-07-28 NOTE — ED Provider Notes (Signed)
[place] EMERGENCY DEPARTMENT AT [facility] Provider Note   CSN: [id number] Arrival date & time: 07/28/23  1614     History {Add pertinent medical, surgical, social history, OB history to HPI:1} Chief Complaint  Patient presents with   Abdominal Pain   Emesis    Jacqueline Coleman is a 62 y.o. female.   Abdominal Pain Associated symptoms: vomiting   Emesis Associated symptoms: abdominal pain   62 year old female history of type 2 diabetes presenting for abdominal pain and vomiting.  Patient states has a history of gastritis versus gastroparesis which flares up intermittently.  She states for couple days she has had diffuse upper abdominal pain and nausea and vomiting.  Very similar to prior episodes without difference.  No lower abdominal pain.  Normal bowel movements without diarrhea.  No melena, hematochezia, hematemesis.  No chest pain or shortness of breath.  She reports usually gets fluids and some pain medication and feels much better.  She has otherwise been at her baseline health.  No urinary symptoms.  No chest pain or shortness of breath.  No fevers or chills.     Home Medications Prior to Admission medications   Medication Sig Start Date End Date Taking? Authorizing Provider  acetaminophen (TYLENOL) 500 MG tablet Take 2 tablets (1,000 mg total) by mouth every 6 (six) hours as needed. 03/20/21   Barnetta Chapel, PA-C  Biotin 1000 MCG tablet Take 1,000 mcg by mouth daily.    [provider]  cetirizine (ZYRTEC) 10 MG tablet Take 10 mg by mouth daily.    [provider]  Continuous Blood Gluc Sensor (DEXCOM G6 SENSOR) MISC SMARTSIG:1 Each Topical Every 10 Days 08/12/20   [provider]  Continuous Blood Gluc Transmit (DEXCOM G6 TRANSMITTER) MISC  05/28/20   [provider]  estradiol (ESTRACE) 2 MG tablet Take 2 mg by mouth every other day. 02/28/19   [provider]  finasteride (PROSCAR) 5 MG tablet Take 5 mg by  mouth every other day.    [provider]  golimumab (SIMPONI ARIA) 50 MG/4ML SOLN injection Inject 12.5 mg into the vein every 8 (eight) weeks.    [provider]  insulin lispro (HUMALOG) 100 UNIT/ML injection Inject 62 Units into the skin daily. Pump - 2 units per hour. Max bolus - 15 units. Daily Max - 62 units    [provider]  ketoconazole (NIZORAL) 2 % shampoo Apply 1 application topically See admin instructions. Three  times a week 08/11/19   [provider]  leflunomide (ARAVA) 10 MG tablet Take 20 mg by mouth daily. 07/08/20   [provider]  minoxidil (LONITEN) 2.5 MG tablet Take 0.625 mg by mouth daily.    [provider]  ondansetron (ZOFRAN) 4 MG tablet Take 1 tablet (4 mg total) by mouth every 6 (six) hours. 04/19/21   Couture, Cortni S, PA-C  pantoprazole (PROTONIX) 40 MG tablet Take 1 tablet (40 mg total) by mouth 2 (two) times daily before a meal. 04/21/21   Rodolph Bong, MD  progesterone (PROMETRIUM) 100 MG capsule Take 100 mg by mouth every other day. 07/08/20   [provider]  rosuvastatin (CRESTOR) 5 MG tablet Take 5 mg by mouth See admin instructions. Takes 1 tablet three times a week 01/30/21   [provider]  spironolactone (ALDACTONE) 100 MG tablet Take 100 mg by mouth 2 (two) times daily. 08/06/19   [provider]      Allergies  Methotrexate, Adhesive [tape], Codeine, Farxiga [dapagliflozin], Hydrocodone, Lisinopril, Losartan, Metformin hcl, and Prednisone    Review of Systems   Review of Systems  Gastrointestinal:  Positive for abdominal pain and vomiting.  Review of systems completed and notable as per HPI.  ROS otherwise negative.   Physical Exam Updated Vital Signs BP (!) 164/100 (BP Location: Left Arm)   Pulse 83   Temp 98.2 F (36.8 C)   Resp 20   Ht 5\' 3"  (1.6 m)   Wt 72.6 kg   SpO2 99%   BMI 28.34 kg/m  Physical Exam Vitals and nursing note reviewed.   Constitutional:      General: She is not in acute distress.    Appearance: She is well-developed.  HENT:     Head: Normocephalic and atraumatic.  Eyes:     Conjunctiva/sclera: Conjunctivae normal.  Cardiovascular:     Rate and Rhythm: Normal rate and regular rhythm.     Heart sounds: No murmur heard. Pulmonary:     Effort: Pulmonary effort is normal. No respiratory distress.     Breath sounds: Normal breath sounds.  Abdominal:     Palpations: Abdomen is soft.     Tenderness: There is abdominal tenderness in the epigastric area and left upper quadrant.  Musculoskeletal:        General: No swelling.     Cervical back: Neck supple.  Skin:    General: Skin is warm and dry.     Capillary Refill: Capillary refill takes less than 2 seconds.  Neurological:     Mental Status: She is alert.  Psychiatric:        Mood and Affect: Mood normal.     ED Results / Procedures / Treatments   Labs (all labs ordered are listed, but only abnormal results are displayed) Labs Reviewed  COMPREHENSIVE METABOLIC PANEL - Abnormal; Notable for the following components:      Result Value   Sodium 134 (*)    Chloride 95 (*)    Glucose, Bld 148 (*)    Anion gap 16 (*)    All other components within normal limits  CBC - Abnormal; Notable for the following components:   WBC 15.9 (*)    RBC 5.89 (*)    Hemoglobin 17.8 (*)    HCT 50.7 (*)    All other components within normal limits  URINALYSIS, ROUTINE W REFLEX MICROSCOPIC - Abnormal; Notable for the following components:   Specific Gravity, Urine 1.036 (*)    Hgb urine dipstick SMALL (*)    Ketones, ur >80 (*)    Protein, ur 30 (*)    All other components within normal limits  LIPASE, BLOOD    EKG None  Radiology No results found.  Procedures Procedures  {Document cardiac monitor, telemetry assessment procedure when appropriate:1}  Medications Ordered in ED Medications  ondansetron (ZOFRAN) injection 4 mg (4 mg Intravenous Given  07/28/23 1657)    ED Course/ Medical Decision Making/ A&P   {   Click here for ABCD2, HEART and other calculatorsREFRESH Note before signing :1}                              Medical Decision Making Amount and/or Complexity of Data Reviewed Labs: ordered.  Risk Prescription drug management.   Medical Decision Making:   Jacqueline Coleman is a 62 y.o. female who presented to the ED today with couple days of vomiting, left upper  quadrant pain.  Vital signs reviewed.  On exam she is well-appearing, she has had some mild nausea and feels somewhat better after initial Zofran.  She does appear slightly dry.  She reports history of prior gastritis versus gastroparesis similar to this episode which is consistent.  She status post cholecystectomy, and has no lower abdominal tenderness have low concern for obstruction, pancreatitis, diverticulitis, appendicitis.  No urinary symptoms.  Labs here notable for mild anion gap elevation likely due to ketosis.  She is no signs of UTI.  CBC with leukocytosis and hemoconcentration likely reactive.  I discussed imaging with her, she would prefer to avoid this given is the same as her prior episodes and she is already had cholecystectomy.  I think this is reasonable.  Will treat symptomatically and reevaluate.   {crccomplexity:27900} Reviewed and confirmed nursing documentation for past medical history, family history, social history.  Initial Study Results:   Laboratory  All laboratory results reviewed.  Labs notable for ***  ***EKG EKG was reviewed independently. Rate, rhythm, axis, intervals all examined and without medically relevant abnormality. ST segments without concerns for elevations.    Radiology:  All images reviewed independently. ***Agree with radiology report at this time.      Consults: Case discussed with ***.   Reassessment and Plan:   ***    Patient's presentation is most consistent with {EM COPA:27473}     {Document  critical care time when appropriate:1} {Document review of labs and clinical decision tools ie heart score, Chads2Vasc2 etc:1}  {Document your independent review of radiology images, and any outside records:1} {Document your discussion with family members, caretakers, and with consultants:1} {Document social determinants of health affecting pt's care:1} {Document your decision making why or why not admission, treatments were needed:1} Final Clinical Impression(s) / ED Diagnoses Final diagnoses:  None    Rx / DC Orders ED Discharge Orders     None

## 2023-07-28 NOTE — ED Notes (Signed)
Report given to the next RN... 

## 2023-07-28 NOTE — Discharge Instructions (Addendum)
I recommend you call your doctor tomorrow to schedule reevaluation.  Your lab work here showed some dehydration.  You should drink plenty of fluids and follow-up closely with your doctor.  If you develop worsening pain, chest pain, persistent vomiting or any other new concerning symptoms you should return to the ED.

## 2023-07-28 NOTE — ED Notes (Signed)
 RN reviewed discharge instructions with pt. Pt verbalized understanding and had no further questions. VSS upon discharge.  

## 2023-08-11 DIAGNOSIS — Z79899 Other long term (current) drug therapy: Secondary | ICD-10-CM | POA: Diagnosis not present

## 2023-08-11 DIAGNOSIS — M2550 Pain in unspecified joint: Secondary | ICD-10-CM | POA: Diagnosis not present

## 2023-08-11 DIAGNOSIS — M0579 Rheumatoid arthritis with rheumatoid factor of multiple sites without organ or systems involvement: Secondary | ICD-10-CM | POA: Diagnosis not present

## 2023-08-11 DIAGNOSIS — Z1589 Genetic susceptibility to other disease: Secondary | ICD-10-CM | POA: Diagnosis not present

## 2023-09-04 DIAGNOSIS — Z794 Long term (current) use of insulin: Secondary | ICD-10-CM | POA: Diagnosis not present

## 2023-09-04 DIAGNOSIS — E1169 Type 2 diabetes mellitus with other specified complication: Secondary | ICD-10-CM | POA: Diagnosis not present

## 2023-09-30 DIAGNOSIS — M0579 Rheumatoid arthritis with rheumatoid factor of multiple sites without organ or systems involvement: Secondary | ICD-10-CM | POA: Diagnosis not present

## 2023-10-15 DIAGNOSIS — E782 Mixed hyperlipidemia: Secondary | ICD-10-CM | POA: Diagnosis not present

## 2023-10-15 DIAGNOSIS — R609 Edema, unspecified: Secondary | ICD-10-CM | POA: Diagnosis not present

## 2023-10-15 DIAGNOSIS — E1169 Type 2 diabetes mellitus with other specified complication: Secondary | ICD-10-CM | POA: Diagnosis not present

## 2023-10-15 DIAGNOSIS — M459 Ankylosing spondylitis of unspecified sites in spine: Secondary | ICD-10-CM | POA: Diagnosis not present

## 2023-10-27 DIAGNOSIS — M545 Low back pain, unspecified: Secondary | ICD-10-CM | POA: Diagnosis not present

## 2023-10-28 DIAGNOSIS — M0579 Rheumatoid arthritis with rheumatoid factor of multiple sites without organ or systems involvement: Secondary | ICD-10-CM | POA: Diagnosis not present

## 2023-10-30 DIAGNOSIS — R609 Edema, unspecified: Secondary | ICD-10-CM | POA: Diagnosis not present

## 2023-10-30 DIAGNOSIS — F419 Anxiety disorder, unspecified: Secondary | ICD-10-CM | POA: Diagnosis not present

## 2023-10-30 DIAGNOSIS — K3189 Other diseases of stomach and duodenum: Secondary | ICD-10-CM | POA: Diagnosis not present

## 2023-11-24 DIAGNOSIS — E1169 Type 2 diabetes mellitus with other specified complication: Secondary | ICD-10-CM | POA: Diagnosis not present

## 2023-11-24 DIAGNOSIS — Z794 Long term (current) use of insulin: Secondary | ICD-10-CM | POA: Diagnosis not present

## 2023-11-25 DIAGNOSIS — M0579 Rheumatoid arthritis with rheumatoid factor of multiple sites without organ or systems involvement: Secondary | ICD-10-CM | POA: Diagnosis not present

## 2023-12-01 DIAGNOSIS — G4733 Obstructive sleep apnea (adult) (pediatric): Secondary | ICD-10-CM | POA: Diagnosis not present

## 2023-12-03 DIAGNOSIS — R609 Edema, unspecified: Secondary | ICD-10-CM | POA: Diagnosis not present

## 2023-12-29 DIAGNOSIS — M0579 Rheumatoid arthritis with rheumatoid factor of multiple sites without organ or systems involvement: Secondary | ICD-10-CM | POA: Diagnosis not present

## 2023-12-29 DIAGNOSIS — Z1589 Genetic susceptibility to other disease: Secondary | ICD-10-CM | POA: Diagnosis not present

## 2023-12-29 DIAGNOSIS — Z79899 Other long term (current) drug therapy: Secondary | ICD-10-CM | POA: Diagnosis not present

## 2023-12-29 DIAGNOSIS — M25531 Pain in right wrist: Secondary | ICD-10-CM | POA: Diagnosis not present

## 2023-12-29 DIAGNOSIS — M2550 Pain in unspecified joint: Secondary | ICD-10-CM | POA: Diagnosis not present

## 2024-01-05 DIAGNOSIS — E1169 Type 2 diabetes mellitus with other specified complication: Secondary | ICD-10-CM | POA: Diagnosis not present

## 2024-01-05 DIAGNOSIS — E538 Deficiency of other specified B group vitamins: Secondary | ICD-10-CM | POA: Diagnosis not present

## 2024-01-05 DIAGNOSIS — E785 Hyperlipidemia, unspecified: Secondary | ICD-10-CM | POA: Diagnosis not present

## 2024-01-12 DIAGNOSIS — E1169 Type 2 diabetes mellitus with other specified complication: Secondary | ICD-10-CM | POA: Diagnosis not present

## 2024-01-12 DIAGNOSIS — E785 Hyperlipidemia, unspecified: Secondary | ICD-10-CM | POA: Diagnosis not present

## 2024-01-12 DIAGNOSIS — K3184 Gastroparesis: Secondary | ICD-10-CM | POA: Diagnosis not present

## 2024-01-12 DIAGNOSIS — Z9641 Presence of insulin pump (external) (internal): Secondary | ICD-10-CM | POA: Diagnosis not present

## 2024-01-14 DIAGNOSIS — E1169 Type 2 diabetes mellitus with other specified complication: Secondary | ICD-10-CM | POA: Diagnosis not present

## 2024-02-05 DIAGNOSIS — E1343 Other specified diabetes mellitus with diabetic autonomic (poly)neuropathy: Secondary | ICD-10-CM | POA: Diagnosis not present

## 2024-02-05 DIAGNOSIS — M069 Rheumatoid arthritis, unspecified: Secondary | ICD-10-CM | POA: Diagnosis not present

## 2024-02-05 DIAGNOSIS — F419 Anxiety disorder, unspecified: Secondary | ICD-10-CM | POA: Diagnosis not present

## 2024-02-05 DIAGNOSIS — M459 Ankylosing spondylitis of unspecified sites in spine: Secondary | ICD-10-CM | POA: Diagnosis not present

## 2024-02-15 DIAGNOSIS — Z124 Encounter for screening for malignant neoplasm of cervix: Secondary | ICD-10-CM | POA: Diagnosis not present

## 2024-02-15 DIAGNOSIS — Z6828 Body mass index (BMI) 28.0-28.9, adult: Secondary | ICD-10-CM | POA: Diagnosis not present

## 2024-02-15 DIAGNOSIS — Z01419 Encounter for gynecological examination (general) (routine) without abnormal findings: Secondary | ICD-10-CM | POA: Diagnosis not present

## 2024-02-15 DIAGNOSIS — Z1231 Encounter for screening mammogram for malignant neoplasm of breast: Secondary | ICD-10-CM | POA: Diagnosis not present

## 2024-04-25 DIAGNOSIS — R140 Abdominal distension (gaseous): Secondary | ICD-10-CM | POA: Diagnosis not present

## 2024-04-25 DIAGNOSIS — R112 Nausea with vomiting, unspecified: Secondary | ICD-10-CM | POA: Diagnosis not present

## 2024-04-25 DIAGNOSIS — R1084 Generalized abdominal pain: Secondary | ICD-10-CM | POA: Diagnosis not present

## 2024-04-25 DIAGNOSIS — R198 Other specified symptoms and signs involving the digestive system and abdomen: Secondary | ICD-10-CM | POA: Diagnosis not present

## 2024-04-25 DIAGNOSIS — K219 Gastro-esophageal reflux disease without esophagitis: Secondary | ICD-10-CM | POA: Diagnosis not present

## 2024-05-16 DIAGNOSIS — K3189 Other diseases of stomach and duodenum: Secondary | ICD-10-CM | POA: Diagnosis not present

## 2024-05-16 DIAGNOSIS — R112 Nausea with vomiting, unspecified: Secondary | ICD-10-CM | POA: Diagnosis not present

## 2024-05-16 DIAGNOSIS — E1043 Type 1 diabetes mellitus with diabetic autonomic (poly)neuropathy: Secondary | ICD-10-CM | POA: Diagnosis not present

## 2024-05-16 DIAGNOSIS — K3184 Gastroparesis: Secondary | ICD-10-CM | POA: Diagnosis not present

## 2024-05-16 DIAGNOSIS — R1084 Generalized abdominal pain: Secondary | ICD-10-CM | POA: Diagnosis not present

## 2024-05-16 DIAGNOSIS — Z7985 Long-term (current) use of injectable non-insulin antidiabetic drugs: Secondary | ICD-10-CM | POA: Diagnosis not present

## 2024-05-16 DIAGNOSIS — R1033 Periumbilical pain: Secondary | ICD-10-CM | POA: Diagnosis not present

## 2024-05-23 ENCOUNTER — Ambulatory Visit: Payer: BC Managed Care – PPO | Attending: Internal Medicine | Admitting: Internal Medicine

## 2024-05-23 ENCOUNTER — Encounter: Payer: Self-pay | Admitting: Internal Medicine

## 2024-05-23 VITALS — BP 103/66 | HR 58 | Resp 14 | Ht 62.75 in | Wt 165.0 lb

## 2024-05-23 DIAGNOSIS — M069 Rheumatoid arthritis, unspecified: Secondary | ICD-10-CM | POA: Diagnosis not present

## 2024-05-23 DIAGNOSIS — M797 Fibromyalgia: Secondary | ICD-10-CM | POA: Insufficient documentation

## 2024-05-23 DIAGNOSIS — M459 Ankylosing spondylitis of unspecified sites in spine: Secondary | ICD-10-CM | POA: Insufficient documentation

## 2024-05-23 DIAGNOSIS — Z79899 Other long term (current) drug therapy: Secondary | ICD-10-CM | POA: Diagnosis not present

## 2024-05-23 DIAGNOSIS — M461 Sacroiliitis, not elsewhere classified: Secondary | ICD-10-CM | POA: Diagnosis not present

## 2024-05-23 NOTE — Progress Notes (Signed)
 Office Visit Note  Patient: Jacqueline Coleman             Date of Birth: 1961-08-23           MRN: [id number]             PCP: Seabron Lenis, MD Referring: Aryal, Govinda, MD Visit Date: 05/23/2024 Occupation: Retired  Subjective:  New Patient (Initial Visit) (Patient states she has joint pain always in her wrists and hands. Patient states she does have pain in her neck and shoulders. Patient states she does have rashes on her head and will get them other places. )   Discussed the use of AI scribe software for clinical note transcription with the patient, who gave verbal consent to proceed.  History of Present Illness   Jacqueline Coleman is a 63 year old female with rheumatoid arthritis, ankylosing spondylitis, psoriatic arthritis, and fibromyalgia who presents for a second opinion on her treatment regimen. She was referred by Dr. Aryal for a second opinion due to ongoing symptoms despite treatment.  She has been experiencing persistent symptoms related to her rheumatoid arthritis, ankylosing spondylitis, psoriatic arthritis, and fibromyalgia. Despite being on Cimzia  for over a year, there has been no improvement in her symptoms. She discontinued Cimzia  six months ago but continues to experience flares every couple of months, characterized by body aches and cold-like symptoms. Previous treatments include Humira, Simponi  Aria, and leflunomide  were either ineffective or not tolerated.  She has a history of gastritis, which worsens in the summer, leading to gastric attacks that cause vomiting and incapacitation, requiring her husband's care. Severe reactions to Simponi  Aria infusions led to hospitalizations due to uncontrollable vomiting. An endoscopy in 2022 showed stomach inflammation, but a recent endoscopy found no inflammation.  Joint pain and swelling are particularly pronounced in her feet, knees, and hands, with the left hand being the worst. She experiences numbness in  her left hand, which has persisted since her initial complaint. She has a history of carpal tunnel syndrome in her right hand, which was surgically treated. Her left hand was tested for carpal tunnel syndrome, but treatment was not pursued at that time.  She has a history of skin rashes and nail issues, including a toenail that fell off twice. A dermatologist was consulted, but her rashes were not present during the visit. Her family has a strong history of heart disease, but a complete cardiac workup in 2022 was normal.  Significant fatigue and sleep disturbances are present, with extreme tiredness and inability to sleep through the day. She has tried medications like gabapentin  and Cymbalta for fibromyalgia but could not tolerate them due to side effects. Concentration and memory issues are also reported, which she attributes to her fibromyalgia.      Activities of Daily Living:  Patient reports morning stiffness for 3 hours.   Patient Reports nocturnal pain.  Difficulty dressing/grooming: Reports Difficulty climbing stairs: Reports Difficulty getting out of chair: Reports Difficulty using hands for taps, buttons, cutlery, and/or writing: Reports  Review of Systems  Constitutional:  Positive for fatigue.  HENT:  Positive for mouth dryness. Negative for mouth sores.   Eyes:  Positive for dryness.  Respiratory:  Negative for shortness of breath.   Cardiovascular:  Negative for chest pain and palpitations.  Gastrointestinal:  Negative for blood in stool, constipation and diarrhea.  Endocrine: Negative for increased urination.  Genitourinary:  Negative for involuntary urination.  Musculoskeletal:  Positive for joint pain, gait problem, joint pain,  joint swelling, myalgias, muscle weakness, morning stiffness, muscle tenderness and myalgias.  Skin:  Positive for rash and hair loss. Negative for color change and sensitivity to sunlight.  Allergic/Immunologic: Positive for susceptible to  infections.  Neurological:  Positive for dizziness and headaches.  Hematological:  Negative for swollen glands.  Psychiatric/Behavioral:  Positive for depressed mood and sleep disturbance. The patient is not nervous/anxious.     PMFS History:  Patient Active Problem List   Diagnosis Date Noted   High risk medication use 05/23/2024   Fibromyalgia 05/23/2024   Leukocytosis    SBO (small bowel obstruction) (HCC)    Internal hernia 04/19/2021   Intractable nausea and vomiting 04/19/2021   Cellulitis 04/19/2021   Abdominal pain 03/19/2021   Cholelithiasis 03/19/2021   Vitamin B12 deficiency 03/19/2021   Tobacco abuse 03/19/2021   Presence of insulin  pump (external) (internal) 03/19/2021   Gastroparesis 03/19/2021   Hyperlipidemia, unspecified 03/19/2021   Lymphedema 03/19/2021   Biliary colic 03/19/2021   Ankylosing spondylitis (HCC) 12/27/2020   Elevated blood-pressure reading, without diagnosis of hypertension 12/27/2020   Obesity due to excess calories 11/28/2020   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) 11/28/2020   Low back pain 10/31/2019   Type 2 diabetes mellitus with other specified complication (HCC) 09/01/2019   DKA (diabetic ketoacidosis) (HCC) 08/20/2019   Tobacco dependence 08/20/2019   Marijuana abuse 08/20/2019   Rheumatoid arthritis (HCC)    Degeneration of lumbar intervertebral disc 10/23/2015   Lumbar radiculopathy 08/17/2015   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    Past Medical History:  Diagnosis Date   Ankylosing spondylitis (HCC)    Blepharitis    Diabetes mellitus without complication (HCC)    thought to be LADA   Fibromyalgia    Osteoporosis    Psoriatic arthritis (HCC)    Rheumatoid arthritis (HCC)     Family History  Problem Relation Age of Onset   Diabetes Mother    Diabetes Father    Breast cancer Neg Hx    Past Surgical History:  Procedure Laterality Date   CARPAL TUNNEL RELEASE Right 2003   CESAREAN SECTION      CHOLECYSTECTOMY N/A 03/19/2021   Procedure: LAPAROSCOPIC CHOLECYSTECTOMY;  Surgeon: Rubin Calamity, MD;  Location: [facility];  Service: General;  Laterality: N/A;   Social History   Social History Narrative   Not on file   Immunization History  Administered Date(s) Administered   PFIZER(Purple Top)SARS-COV-2 Vaccination 02/18/2020, 03/12/2020     Objective: Vital Signs: BP 103/66 (BP Location: Right Arm, Patient Position: Sitting, Cuff Size: Normal)   Pulse (!) 58   Resp 14   Ht 5' 2.75 (1.594 m)   Wt 165 lb (74.8 kg)   BMI 29.46 kg/m    Physical Exam  Eyes:     Conjunctiva/sclera: Conjunctivae normal.    Cardiovascular:     Rate and Rhythm: Normal rate and regular rhythm.  Pulmonary:     Effort: Pulmonary effort is normal.     Breath sounds: Normal breath sounds.   Skin:    General: Skin is warm and dry.     Comments: Erythematous rash on crown of head First toenail with dystrophic changes other toenails all appear normal   Neurological:     Mental Status: She is alert.   Psychiatric:        Mood and Affect: Mood normal.      Musculoskeletal Exam:  Neck tenderness to pressure on the right side seems to focus worst at scalene  muscles with some radiation down to the shoulder Shoulders full ROM no tenderness or swelling Elbows full ROM no tenderness or swelling Wrists full ROM no tenderness or swelling Fingers full ROM tenderness to pressure at the base of the right thumb and on radial side of the wrist Back is tender over midline and paraspinal muscles throughout upper and lower back, no particular radiation Hip normal internal and external rotation without pain, no tenderness to lateral hip palpation Knees full ROM no tenderness or swelling Very tender to pressure over the ankle and lower portion of shin, no overlying pitting edema or erythema   Investigation: No additional findings.  Imaging: No results found.  Recent Labs: Lab Results  Component  Value Date   WBC 15.9 (H) 07/28/2023   HGB 17.8 (H) 07/28/2023   PLT 315 07/28/2023   NA 134 (L) 07/28/2023   K 3.5 07/28/2023   CL 95 (L) 07/28/2023   CO2 23 07/28/2023   GLUCOSE 148 (H) 07/28/2023   BUN 15 07/28/2023   CREATININE 0.59 07/28/2023   BILITOT 0.7 07/28/2023   ALKPHOS 67 07/28/2023   AST 18 07/28/2023   ALT 21 07/28/2023   PROT 7.6 07/28/2023   ALBUMIN 4.4 07/28/2023   CALCIUM  9.8 07/28/2023   GFRAA >60 08/15/2020    Speciality Comments: No specialty comments available.  Procedures:  No procedures performed Allergies: Methotrexate, Penicillins, Adhesive [tape], Dapagliflozin, Hydrocodone, Lisinopril, Losartan, Metformin hcl, Prednisone, and Codeine   Assessment / Plan:     Visit Diagnoses: Rheumatoid arthritis, involving unspecified site, unspecified whether rheumatoid factor present (HCC) - Plan: Rheumatoid factor, Cyclic citrul peptide antibody, IgG, Sedimentation rate Chronic rheumatoid arthritis with persistent joint pain and swelling.  There is not a lot of peripheral joint synovitis or dactylitis present on exam this more of the current complaint could be related to her osteoarthritis and fibromyalgia.  Previous TNF inhibitors ineffective. Considering alternative treatments due to lack of efficacy and side effects. Discussed Orencia and Rinvoq/Xeljanz options with considerations for seropositivity and gastrointestinal tolerance. - Recheck inflammatory markers and antibody titers. - Consider Orencia or Rinvoq/Xeljanz based on seropositivity and tolerance. - Orencia: monthly infusion or weekly self-injection. Rinvoq/Xeljanz: oral medications.  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  Previous TNF inhibitors ineffective. Possible overlap with other autoimmune conditions. Discussed Orencia and Rinvoq/Xeljanz options with considerations for seropositivity and gastrointestinal tolerance. - Recheck inflammatory markers and antibody titers.  Fibromyalgia Chronic fibromyalgia with widespread pain, sleep disturbance, and fatigue. Discussed overlap with other conditions and potential role in exacerbating symptoms. Provided information on management. - Provide information on fibromyalgia management, including [employer]  resources. - Encourage lifestyle modifications: diet, exercise, stress management.  Gastritis Gastritis with previous episodes. Recent endoscopy showed no inflammation. Discussed possible IBS overlap with fibromyalgia. - Monitor gastrointestinal symptoms and consider role of ankylosing spondylitis or IBS.  Carpal tunnel syndrome Carpal tunnel syndrome with previous surgery. Current symptoms do include numbness and pain in median nerve distribution.  Exam of the wrist mostly seems related to radial styloid tenosynovitis or arthritis of the first [facility] joint.  Plantar fasciitis Plantar fasciitis with ongoing foot and ankle pain. Previous treatments included supportive boots.    Orders: Orders Placed This Encounter  Procedures   Rheumatoid factor  Cyclic citrul peptide antibody, IgG   Sedimentation rate   C-reactive protein   C3 and C4   No orders of the defined types were placed in this encounter.  Follow-Up Instructions: No follow-ups on file.   Lonni LELON Ester, MD  Note - This record has been created using [employer].  Chart creation errors have been sought, but may not always  have been located. Such creation errors do not reflect on  the standard of medical care.

## 2024-05-23 NOTE — Patient Instructions (Signed)
 I recommend checking out the [place] [facility] [place] patient-centered guide for fibromyalgia and chronic pain management: [email or web address]

## 2024-05-25 LAB — RHEUMATOID FACTOR: Rheumatoid fact SerPl-aCnc: 33 [IU]/mL — ABNORMAL HIGH (ref <14)

## 2024-05-25 LAB — SEDIMENTATION RATE: Sed Rate: 2 mm/h (ref 0–30)

## 2024-05-25 LAB — C-REACTIVE PROTEIN: CRP: <3 mg/L (ref <8.0)

## 2024-05-25 LAB — C3 AND C4
C3 Complement: 159 mg/dL (ref 83–193)
C4 Complement: 27 mg/dL (ref 15–57)

## 2024-05-25 LAB — CYCLIC CITRUL PEPTIDE ANTIBODY, IGG: Cyclic Citrullin Peptide Ab: <16 U

## 2024-06-14 DIAGNOSIS — M0579 Rheumatoid arthritis with rheumatoid factor of multiple sites without organ or systems involvement: Secondary | ICD-10-CM | POA: Diagnosis not present

## 2024-06-14 DIAGNOSIS — E1169 Type 2 diabetes mellitus with other specified complication: Secondary | ICD-10-CM | POA: Diagnosis not present

## 2024-06-14 DIAGNOSIS — E785 Hyperlipidemia, unspecified: Secondary | ICD-10-CM | POA: Diagnosis not present

## 2024-06-20 DIAGNOSIS — Z79899 Other long term (current) drug therapy: Secondary | ICD-10-CM | POA: Diagnosis not present

## 2024-06-20 DIAGNOSIS — M2550 Pain in unspecified joint: Secondary | ICD-10-CM | POA: Diagnosis not present

## 2024-06-20 DIAGNOSIS — M0579 Rheumatoid arthritis with rheumatoid factor of multiple sites without organ or systems involvement: Secondary | ICD-10-CM | POA: Diagnosis not present

## 2024-06-20 DIAGNOSIS — Z1589 Genetic susceptibility to other disease: Secondary | ICD-10-CM | POA: Diagnosis not present

## 2024-06-21 DIAGNOSIS — M0579 Rheumatoid arthritis with rheumatoid factor of multiple sites without organ or systems involvement: Secondary | ICD-10-CM | POA: Diagnosis not present

## 2024-06-21 DIAGNOSIS — E785 Hyperlipidemia, unspecified: Secondary | ICD-10-CM | POA: Diagnosis not present

## 2024-06-21 DIAGNOSIS — E1169 Type 2 diabetes mellitus with other specified complication: Secondary | ICD-10-CM | POA: Diagnosis not present

## 2024-06-21 DIAGNOSIS — K3184 Gastroparesis: Secondary | ICD-10-CM | POA: Diagnosis not present

## 2024-07-03 ENCOUNTER — Encounter (HOSPITAL_BASED_OUTPATIENT_CLINIC_OR_DEPARTMENT_OTHER): Payer: Self-pay | Admitting: Emergency Medicine

## 2024-07-03 ENCOUNTER — Other Ambulatory Visit: Payer: Self-pay

## 2024-07-03 ENCOUNTER — Emergency Department (HOSPITAL_BASED_OUTPATIENT_CLINIC_OR_DEPARTMENT_OTHER): Admission: EM | Admit: 2024-07-03 | Discharge: 2024-07-03 | Disposition: A | Discharge status: Home / Self Care

## 2024-07-03 DIAGNOSIS — K2970 Gastritis, unspecified, without bleeding: Secondary | ICD-10-CM | POA: Diagnosis not present

## 2024-07-03 DIAGNOSIS — Z794 Long term (current) use of insulin: Secondary | ICD-10-CM | POA: Diagnosis not present

## 2024-07-03 DIAGNOSIS — E119 Type 2 diabetes mellitus without complications: Secondary | ICD-10-CM | POA: Insufficient documentation

## 2024-07-03 DIAGNOSIS — D72829 Elevated white blood cell count, unspecified: Secondary | ICD-10-CM | POA: Insufficient documentation

## 2024-07-03 DIAGNOSIS — R109 Unspecified abdominal pain: Secondary | ICD-10-CM | POA: Diagnosis not present

## 2024-07-03 DIAGNOSIS — K2950 Unspecified chronic gastritis without bleeding: Secondary | ICD-10-CM | POA: Insufficient documentation

## 2024-07-03 DIAGNOSIS — R112 Nausea with vomiting, unspecified: Secondary | ICD-10-CM | POA: Diagnosis not present

## 2024-07-03 LAB — CBC WITH DIFFERENTIAL/PLATELET
Abs Immature Granulocytes: 0.09 K/uL — ABNORMAL HIGH (ref 0.00–0.07)
Basophils Absolute: 0 K/uL (ref 0.0–0.1)
Basophils Relative: 0 %
Eosinophils Absolute: 0 K/uL (ref 0.0–0.5)
Eosinophils Relative: 0 %
HCT: 48 % — ABNORMAL HIGH (ref 36.0–46.0)
Hemoglobin: 17.2 g/dL — ABNORMAL HIGH (ref 12.0–15.0)
Immature Granulocytes: 1 %
Lymphocytes Relative: 19 %
Lymphs Abs: 2.6 K/uL (ref 0.7–4.0)
MCH: 30.2 pg (ref 26.0–34.0)
MCHC: 35.8 g/dL (ref 30.0–36.0)
MCV: 84.2 fL (ref 80.0–100.0)
Monocytes Absolute: 0.8 K/uL (ref 0.1–1.0)
Monocytes Relative: 6 %
Neutro Abs: 10.5 K/uL — ABNORMAL HIGH (ref 1.7–7.7)
Neutrophils Relative %: 74 %
Platelets: 267 K/uL (ref 150–400)
RBC: 5.7 MIL/uL — ABNORMAL HIGH (ref 3.87–5.11)
RDW: 11.7 % (ref 11.5–15.5)
WBC: 14 K/uL — ABNORMAL HIGH (ref 4.0–10.5)
nRBC: 0 % (ref 0.0–0.2)

## 2024-07-03 LAB — COMPREHENSIVE METABOLIC PANEL WITH GFR
ALT: 23 U/L (ref 0–44)
ALT: 20 U/L (ref 0–44)
AST: 22 U/L (ref 15–41)
AST: 19 U/L (ref 15–41)
Albumin: 4.3 g/dL (ref 3.5–5.0)
Albumin: 3.8 g/dL (ref 3.5–5.0)
Alkaline Phosphatase: 79 U/L (ref 38–126)
Alkaline Phosphatase: 67 U/L (ref 38–126)
Anion gap: 22 — ABNORMAL HIGH (ref 5–15)
Anion gap: 15 (ref 5–15)
BUN: 25 mg/dL — ABNORMAL HIGH (ref 8–23)
BUN: 19 mg/dL (ref 8–23)
CO2: 20 mmol/L — ABNORMAL LOW (ref 22–32)
CO2: 24 mmol/L (ref 22–32)
Calcium: 10.4 mg/dL — ABNORMAL HIGH (ref 8.9–10.3)
Calcium: 9.3 mg/dL (ref 8.9–10.3)
Chloride: 88 mmol/L — ABNORMAL LOW (ref 98–111)
Chloride: 96 mmol/L — ABNORMAL LOW (ref 98–111)
Creatinine, Ser: 0.8 mg/dL (ref 0.44–1.00)
Creatinine, Ser: 0.62 mg/dL (ref 0.44–1.00)
GFR, Estimated: >60 mL/min (ref >60)
GFR, Estimated: >60 mL/min (ref >60)
Glucose, Bld: 172 mg/dL — ABNORMAL HIGH (ref 70–99)
Glucose, Bld: 86 mg/dL (ref 70–99)
Potassium: 3.4 mmol/L — ABNORMAL LOW (ref 3.5–5.1)
Potassium: 3 mmol/L — ABNORMAL LOW (ref 3.5–5.1)
Sodium: 130 mmol/L — ABNORMAL LOW (ref 135–145)
Sodium: 135 mmol/L (ref 135–145)
Total Bilirubin: 0.7 mg/dL (ref 0.0–1.2)
Total Bilirubin: 0.6 mg/dL (ref 0.0–1.2)
Total Protein: 7.6 g/dL (ref 6.5–8.1)
Total Protein: 6.6 g/dL (ref 6.5–8.1)

## 2024-07-03 LAB — I-STAT VENOUS BLOOD GAS, ED
Acid-Base Excess: 4 mmol/L — ABNORMAL HIGH (ref 0.0–2.0)
Bicarbonate: 28.8 mmol/L — ABNORMAL HIGH (ref 20.0–28.0)
Calcium, Ion: 1.12 mmol/L — ABNORMAL LOW (ref 1.15–1.40)
HCT: 44 % (ref 36.0–46.0)
Hemoglobin: 15 g/dL (ref 12.0–15.0)
O2 Saturation: 72 %
Patient temperature: 98.2
Potassium: 5.8 mmol/L — ABNORMAL HIGH (ref 3.5–5.1)
Sodium: 131 mmol/L — ABNORMAL LOW (ref 135–145)
TCO2: 30 mmol/L (ref 22–32)
pCO2, Ven: 44.3 mmHg (ref 44–60)
pH, Ven: 7.421 (ref 7.25–7.43)
pO2, Ven: 37 mmHg (ref 32–45)

## 2024-07-03 LAB — URINALYSIS, ROUTINE W REFLEX MICROSCOPIC
Bacteria, UA: NONE SEEN
Glucose, UA: NEGATIVE mg/dL
Ketones, ur: >80 mg/dL — AB
Leukocytes,Ua: NEGATIVE
Nitrite: NEGATIVE
Specific Gravity, Urine: 1.026 (ref 1.005–1.030)
pH: 6 (ref 5.0–8.0)

## 2024-07-03 LAB — LIPASE, BLOOD: Lipase: 32 U/L (ref 11–51)

## 2024-07-03 MED ORDER — SODIUM CHLORIDE 0.9 % IV BOLUS
1000.0000 mL | Freq: Once | INTRAVENOUS | Status: AC
  Administered 2024-07-03: 1000 mL via INTRAVENOUS

## 2024-07-03 MED ORDER — ONDANSETRON HCL 4 MG/2ML IJ SOLN
4.0000 mg | Freq: Once | INTRAMUSCULAR | Status: AC
  Administered 2024-07-03: 4 mg via INTRAVENOUS
  Filled 2024-07-03: qty 2

## 2024-07-03 MED ORDER — POTASSIUM CHLORIDE CRYS ER 20 MEQ PO TBCR: 20.0000 meq | EXTENDED_RELEASE_TABLET | Freq: Two times a day (BID) | ORAL | 0 refills | Status: AC

## 2024-07-03 MED ORDER — POTASSIUM CHLORIDE CRYS ER 20 MEQ PO TBCR
40.0000 meq | EXTENDED_RELEASE_TABLET | Freq: Once | ORAL | Status: AC
  Administered 2024-07-03: 40 meq via ORAL
  Filled 2024-07-03: qty 2

## 2024-07-03 MED ORDER — FENTANYL CITRATE PF 50 MCG/ML IJ SOSY
50.0000 ug | PREFILLED_SYRINGE | Freq: Once | INTRAMUSCULAR | Status: AC
  Administered 2024-07-03: 50 ug via INTRAVENOUS
  Filled 2024-07-03: qty 1

## 2024-07-03 NOTE — ED Notes (Signed)
 Assisted with w/c to restroom

## 2024-07-03 NOTE — ED Triage Notes (Signed)
 C/o ABD pain and emesis since Wednesday. States she is having a gastritis flare up. Hx of same.

## 2024-07-03 NOTE — ED Provider Notes (Signed)
 [place] [facility]ERGENCY DEPARTMENT AT [facility] Provider Note   CSN: [id number] Arrival date & time: 07/03/24  1118     Patient presents with: Abdominal Pain   Jacqueline Coleman is a 63 y.o. female with history of ankylosing spondylitis, blepharitis, diabetes, fibromyalgia, osteoporosis, chronic gastritis.  The patient presents to the ED for evaluation of abdominal pain, nausea and vomiting.  Reports that she has had a gastritis flare since Wednesday of this week.  States she has a history of gastritis, typically takes Carafate and Protonix  but unable to keep these down.  Reports 25 episodes of nausea and vomiting since Wednesday.  Denies blood in her vomit.  Endorsing diarrhea as well but states she was taking MiraLAX prior to Wednesday because she felt constipated.  Denies blood in her stool.  Denies fevers at home, dysuria, flank pain.  Denies chest pain or shortness of breath.  Reports that this feels very similar to past episodes of gastritis.    Abdominal Pain      Prior to Admission medications   Medication Sig Start Date End Date Taking? Authorizing Provider  potassium chloride  SA (KLOR-CON  M) 20 MEQ tablet Take 1 tablet (20 mEq total) by mouth 2 (two) times daily. 07/03/24  Yes Ruthell Lonni FALCON, PA-C  acetaminophen  (TYLENOL ) 500 MG tablet Take 2 tablets (1,000 mg total) by mouth every 6 (six) hours as needed. Patient not taking: Reported on 05/23/2024 03/20/21   Tammy Sor, PA-C  Biotin 1000 MCG tablet Take 1,000 mcg by mouth daily.    [provider]  cetirizine (ZYRTEC) 10 MG tablet Take 10 mg by mouth daily.    [provider]  cyclobenzaprine (FLEXERIL) 10 MG tablet Take 10 mg by mouth as needed. 12/17/21   [provider]  estradiol  (ESTRACE ) 2 MG tablet Take 2 mg by mouth every other day. 02/28/19   [provider]  insulin  lispro (HUMALOG) 100 UNIT/ML injection Inject 62 Units into the skin daily. Pump - 2 units per hour.  Max bolus - 15 units. Daily Max - 62 units Patient not taking: Reported on 05/23/2024    [provider]  MOUNJARO 5 MG/0.5ML Pen INJECT 5 MG SUBCUTANEOUSLY ONCE A WEEK 01/17/23   [provider]  omeprazole (PRILOSEC) 40 MG capsule Take 40 mg by mouth. 04/25/24 04/25/25  [provider]  pantoprazole  (PROTONIX ) 40 MG tablet Take 1 tablet (40 mg total) by mouth 2 (two) times daily before a meal. Patient not taking: Reported on 05/23/2024 04/21/21   Sebastian Toribio GAILS, MD  progesterone  (PROMETRIUM ) 100 MG capsule Take 100 mg by mouth every other day. 07/08/20   [provider]  riboflavin (VITAMIN B-2) 100 MG TABS tablet Take 100 mg by mouth.    [provider]  rosuvastatin  (CRESTOR ) 5 MG tablet Take 5 mg by mouth See admin instructions. Takes 1 tablet three times a week 01/30/21   [provider]  scopolamine (TRANSDERM-SCOP) 1 MG/3DAYS 1 patch to skin behind the ear as needed Transdermal as directed for 30 days 07/29/23   [provider]  sertraline (ZOLOFT) 50 MG tablet Take 50 mg by mouth daily. 02/05/24   [provider]  simethicone  (MYLICON) 80 MG chewable tablet Chew 80 mg by mouth.    [provider]  spironolactone  (ALDACTONE ) 100 MG tablet Take 100 mg by mouth 2 (two) times daily. 08/06/19   [provider]  sucralfate (CARAFATE) 1 GM/10ML suspension     [provider]  Allergies: Methotrexate, Penicillins, Adhesive [tape], Dapagliflozin, Hydrocodone, Lisinopril, Losartan, Metformin hcl, Prednisone, and Codeine    Review of Systems  Gastrointestinal:  Positive for abdominal pain.    Updated Vital Signs BP 123/78 (BP Location: Right Arm)   Pulse 61   Temp 98.2 F (36.8 C)   Resp 18   SpO2 99%   Physical Exam Vitals and nursing note reviewed.  Constitutional:      General: She is not in acute distress.    Appearance: She is well-developed.  HENT:     Head: Normocephalic and  atraumatic.  Eyes:     Conjunctiva/sclera: Conjunctivae normal.  Cardiovascular:     Rate and Rhythm: Normal rate and regular rhythm.     Heart sounds: No murmur heard. Pulmonary:     Effort: Pulmonary effort is normal. No respiratory distress.     Breath sounds: Normal breath sounds.  Abdominal:     Palpations: Abdomen is soft.     Tenderness: There is abdominal tenderness.     Comments: Nonfocal abdominal TTP.  No overlying skin change.  No rebound or guarding.  No CVA tenderness bilaterally.  Musculoskeletal:        General: No swelling.     Cervical back: Neck supple.  Skin:    General: Skin is warm and dry.     Capillary Refill: Capillary refill takes less than 2 seconds.  Neurological:     Mental Status: She is alert.  Psychiatric:        Mood and Affect: Mood normal.     (all labs ordered are listed, but only abnormal results are displayed) Labs Reviewed  CBC WITH DIFFERENTIAL/PLATELET - Abnormal; Notable for the following components:      Result Value   WBC 14.0 (*)    RBC 5.70 (*)    Hemoglobin 17.2 (*)    HCT 48.0 (*)    Neutro Abs 10.5 (*)    Abs Immature Granulocytes 0.09 (*)    All other components within normal limits  COMPREHENSIVE METABOLIC PANEL WITH GFR - Abnormal; Notable for the following components:   Sodium 130 (*)    Potassium 3.4 (*)    Chloride 88 (*)    CO2 20 (*)    Glucose, Bld 172 (*)    BUN 25 (*)    Calcium  10.4 (*)    Anion gap 22 (*)    All other components within normal limits  URINALYSIS, ROUTINE W REFLEX MICROSCOPIC - Abnormal; Notable for the following components:   Hgb urine dipstick SMALL (*)    Bilirubin Urine SMALL (*)    Ketones, ur >80 (*)    Protein, ur TRACE (*)    All other components within normal limits  COMPREHENSIVE METABOLIC PANEL WITH GFR - Abnormal; Notable for the following components:   Potassium 3.0 (*)    Chloride 96 (*)    All other components within normal limits  I-STAT VENOUS BLOOD GAS, ED -  Abnormal; Notable for the following components:   Bicarbonate 28.8 (*)    Acid-Base Excess 4.0 (*)    Sodium 131 (*)    Potassium 5.8 (*)    Calcium , Ion 1.12 (*)    All other components within normal limits  LIPASE, BLOOD    EKG: None  Radiology: No results found.   Procedures   Medications Ordered in the ED  fentaNYL  (SUBLIMAZE ) injection 50 mcg (50 mcg Intravenous Given 07/03/24 1207)  sodium chloride  0.9 % bolus 1,000 mL (0 mLs Intravenous  Stopped 07/03/24 1327)  ondansetron  (ZOFRAN ) injection 4 mg (4 mg Intravenous Given 07/03/24 1208)  sodium chloride  0.9 % bolus 1,000 mL (0 mLs Intravenous Stopped 07/03/24 1449)  potassium chloride  SA (KLOR-CON  M) CR tablet 40 mEq (40 mEq Oral Given 07/03/24 1716)     Medical Decision Making Amount and/or Complexity of Data Reviewed Labs: ordered.  Risk Prescription drug management.   This is a 63 year old female with a history of chronic arthritis presents to the ED for evaluation.  On exam, she is afebrile and nontachycardic.  Her lung sounds are clear bilaterally and she is not hypoxic.  She has diffuse tenderness to her abdomen but there is no rebound or guarding.  No CVA tenderness bilaterally.  Overall here she is nontoxic in appearance with reassuring vital signs.  No apparent distress.  Patient presents with complaint of chronic gastritis.  Her labs were collected.  Her labs reveal leukocytosis of 14 but she has had multiple sets of nausea and vomiting over the last few days.  Her hemoglobin is stable at 17.2.  Her metabolic panel reveals sodium 869, potassium 3.4, BUN 25, calcium  10.4, anion gap 22.  Patient urinalysis shows trace protein and ketones.  She received 2 L of fluid, 4 mg of Zofran  and 50 mcg of fentanyl .  On reassessment, she reports she feels much better at this time.  I have recollected a metabolic panel to assess if her anion gap elevation has resolved after 2 L of fluid.  Update: Patient repeat metabolic panel  reveals potassium 3.0, anion gap 15.  Patient given 40 mEq oral potassium at this time.  She will be sent home with 3 days of 20 mEq potassium taken twice daily.  She was advised that if she develops any nausea or vomiting she will need to immediately return to the ED for further management.  She has had no nausea or vomiting here.  She reports she feels much better.  Encouraged to continue hydrating at home.  She is requesting discharge.  At this time patient be discharged home.  She reports she has antinausea medication at home.  She will be discharged with potassium.  Vies to follow-up with the GI doctor given strict return precautions to include nausea and vomiting she voiced understanding.  Stable to discharge.     Final diagnoses:  Nausea and vomiting, unspecified vomiting type  Chronic gastritis, presence of bleeding unspecified, unspecified gastritis type    ED Discharge Orders          Ordered    potassium chloride  SA (KLOR-CON  M) 20 MEQ tablet  2 times daily        07/03/24 1722               Ruthell Lonni FALCON, PA-C 07/03/24 1723    Simon Lavonia SAILOR, MD 07/04/24 [phone]

## 2024-07-03 NOTE — Discharge Instructions (Signed)
 It was a pleasure taking part in your care.  As discussed, your workup here shows that you are dehydrated.  You received 2 L of fluid and your labs returned to normal values.  Please follow-up with your GI doctor.  Please continue take Protonix  and Carafate at home.  Please continue applying antinausea patch behind your ear as discussed.  Continue hydrating yourself at home with Pedialyte or Gatorade.  Continue taking Protonix  and Carafate for stomach pain.  Return to the ED with any return of nausea or vomiting.  Please begin taking 1 tablet of potassium twice a day for the next 4 days.

## 2024-07-25 DIAGNOSIS — E1169 Type 2 diabetes mellitus with other specified complication: Secondary | ICD-10-CM | POA: Diagnosis not present

## 2024-07-25 DIAGNOSIS — E538 Deficiency of other specified B group vitamins: Secondary | ICD-10-CM | POA: Diagnosis not present

## 2024-07-25 DIAGNOSIS — E785 Hyperlipidemia, unspecified: Secondary | ICD-10-CM | POA: Diagnosis not present

## 2024-07-30 DIAGNOSIS — J209 Acute bronchitis, unspecified: Secondary | ICD-10-CM | POA: Diagnosis not present

## 2024-08-22 DIAGNOSIS — E1169 Type 2 diabetes mellitus with other specified complication: Secondary | ICD-10-CM | POA: Diagnosis not present

## 2024-08-22 DIAGNOSIS — E785 Hyperlipidemia, unspecified: Secondary | ICD-10-CM | POA: Diagnosis not present

## 2024-08-22 DIAGNOSIS — K3184 Gastroparesis: Secondary | ICD-10-CM | POA: Diagnosis not present

## 2024-08-22 DIAGNOSIS — R202 Paresthesia of skin: Secondary | ICD-10-CM | POA: Diagnosis not present

## 2024-08-23 DIAGNOSIS — R112 Nausea with vomiting, unspecified: Secondary | ICD-10-CM | POA: Diagnosis not present

## 2024-08-23 DIAGNOSIS — K2950 Unspecified chronic gastritis without bleeding: Secondary | ICD-10-CM | POA: Diagnosis not present

## 2024-08-23 DIAGNOSIS — F419 Anxiety disorder, unspecified: Secondary | ICD-10-CM | POA: Diagnosis not present

## 2024-08-23 DIAGNOSIS — R609 Edema, unspecified: Secondary | ICD-10-CM | POA: Diagnosis not present

## 2024-08-25 DIAGNOSIS — M0579 Rheumatoid arthritis with rheumatoid factor of multiple sites without organ or systems involvement: Secondary | ICD-10-CM | POA: Diagnosis not present

## 2024-09-08 DIAGNOSIS — M0579 Rheumatoid arthritis with rheumatoid factor of multiple sites without organ or systems involvement: Secondary | ICD-10-CM | POA: Diagnosis not present

## 2024-09-08 DIAGNOSIS — R898 Other abnormal findings in specimens from other organs, systems and tissues: Secondary | ICD-10-CM | POA: Diagnosis not present

## 2024-09-21 DIAGNOSIS — E1169 Type 2 diabetes mellitus with other specified complication: Secondary | ICD-10-CM | POA: Diagnosis not present

## 2024-09-21 DIAGNOSIS — S66912A Strain of unspecified muscle, fascia and tendon at wrist and hand level, left hand, initial encounter: Secondary | ICD-10-CM | POA: Diagnosis not present

## 2024-09-21 DIAGNOSIS — M0579 Rheumatoid arthritis with rheumatoid factor of multiple sites without organ or systems involvement: Secondary | ICD-10-CM | POA: Diagnosis not present

## 2024-09-21 DIAGNOSIS — S66911A Strain of unspecified muscle, fascia and tendon at wrist and hand level, right hand, initial encounter: Secondary | ICD-10-CM | POA: Diagnosis not present

## 2024-09-21 DIAGNOSIS — S63501A Unspecified sprain of right wrist, initial encounter: Secondary | ICD-10-CM | POA: Diagnosis not present

## 2024-09-22 DIAGNOSIS — M0579 Rheumatoid arthritis with rheumatoid factor of multiple sites without organ or systems involvement: Secondary | ICD-10-CM | POA: Diagnosis not present

## 2024-09-28 DIAGNOSIS — E785 Hyperlipidemia, unspecified: Secondary | ICD-10-CM | POA: Diagnosis not present

## 2024-09-28 DIAGNOSIS — Z4681 Encounter for fitting and adjustment of insulin pump: Secondary | ICD-10-CM | POA: Diagnosis not present

## 2024-09-28 DIAGNOSIS — M8580 Other specified disorders of bone density and structure, unspecified site: Secondary | ICD-10-CM | POA: Diagnosis not present

## 2024-09-28 DIAGNOSIS — E1169 Type 2 diabetes mellitus with other specified complication: Secondary | ICD-10-CM | POA: Diagnosis not present

## 2024-10-02 DIAGNOSIS — R051 Acute cough: Secondary | ICD-10-CM | POA: Diagnosis not present

## 2024-10-02 DIAGNOSIS — J209 Acute bronchitis, unspecified: Secondary | ICD-10-CM | POA: Diagnosis not present

## 2024-10-21 DIAGNOSIS — M0579 Rheumatoid arthritis with rheumatoid factor of multiple sites without organ or systems involvement: Secondary | ICD-10-CM | POA: Diagnosis not present

## 2024-11-01 DIAGNOSIS — E1165 Type 2 diabetes mellitus with hyperglycemia: Secondary | ICD-10-CM | POA: Diagnosis not present

## 2024-11-18 DIAGNOSIS — E119 Type 2 diabetes mellitus without complications: Secondary | ICD-10-CM | POA: Diagnosis not present

## 2024-11-18 DIAGNOSIS — H0288A Meibomian gland dysfunction right eye, upper and lower eyelids: Secondary | ICD-10-CM | POA: Diagnosis not present

## 2024-11-18 DIAGNOSIS — H2513 Age-related nuclear cataract, bilateral: Secondary | ICD-10-CM | POA: Diagnosis not present

## 2024-11-18 DIAGNOSIS — M0579 Rheumatoid arthritis with rheumatoid factor of multiple sites without organ or systems involvement: Secondary | ICD-10-CM | POA: Diagnosis not present

## 2024-11-18 DIAGNOSIS — H04123 Dry eye syndrome of bilateral lacrimal glands: Secondary | ICD-10-CM | POA: Diagnosis not present
